# Patient Record
Sex: Male | Born: 1985
Health system: Southern US, Community
[De-identification: ages and names within clinical notes are randomized; demographics above are authoritative.]

## PROBLEM LIST (undated history)

## (undated) ENCOUNTER — Ambulatory Visit (HOSPITAL_COMMUNITY): Admission: EM | Payer: Self-pay

---
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
## 2005-07-09 ENCOUNTER — Emergency Department (HOSPITAL_COMMUNITY): Admission: EM | Admit: 2005-07-09 | Discharge: 2005-07-09 | Payer: Self-pay | Admitting: Emergency Medicine

## 2005-10-03 ENCOUNTER — Emergency Department (HOSPITAL_COMMUNITY): Admission: EM | Admit: 2005-10-03 | Discharge: 2005-10-03 | Payer: Self-pay | Admitting: Emergency Medicine

## 2005-12-04 ENCOUNTER — Emergency Department (HOSPITAL_COMMUNITY): Admission: EM | Admit: 2005-12-04 | Discharge: 2005-12-04 | Payer: Self-pay | Admitting: Emergency Medicine

## 2006-02-24 ENCOUNTER — Emergency Department (HOSPITAL_COMMUNITY): Admission: EM | Admit: 2006-02-24 | Discharge: 2006-02-24 | Payer: Self-pay | Admitting: Emergency Medicine

## 2006-06-17 ENCOUNTER — Emergency Department (HOSPITAL_COMMUNITY): Admission: EM | Admit: 2006-06-17 | Discharge: 2006-06-17 | Payer: Self-pay | Admitting: Emergency Medicine

## 2006-11-17 ENCOUNTER — Emergency Department (HOSPITAL_COMMUNITY): Admission: EM | Admit: 2006-11-17 | Discharge: 2006-11-17 | Payer: Self-pay | Admitting: Emergency Medicine

## 2009-07-21 ENCOUNTER — Emergency Department (HOSPITAL_COMMUNITY): Admission: EM | Admit: 2009-07-21 | Discharge: 2009-07-21 | Payer: Self-pay | Admitting: Family Medicine

## 2009-09-05 ENCOUNTER — Ambulatory Visit: Payer: Self-pay | Admitting: Family Medicine

## 2009-09-29 ENCOUNTER — Ambulatory Visit: Payer: Self-pay | Admitting: Internal Medicine

## 2009-09-29 LAB — CONVERTED CEMR LAB
Eosinophils Absolute: 0.1 10*3/uL (ref 0.0–0.7)
Eosinophils Relative: 2 % (ref 0–5)
GC Probe Amp, Urine: NEGATIVE
HCT: 41.4 % (ref 39.0–52.0)
HDL: 52 mg/dL (ref >39)
Hemoglobin: 13.7 g/dL (ref 13.0–17.0)
LDL Cholesterol: 66 mg/dL (ref 0–99)
Lymphocytes Relative: 34 % (ref 12–46)
Lymphs Abs: 1.2 10*3/uL (ref 0.7–4.0)
MCV: 90.6 fL (ref 78.0–100.0)
Monocytes Relative: 10 % (ref 3–12)
Platelets: 242 10*3/uL (ref 150–400)
RBC: 4.57 M/uL (ref 4.22–5.81)
Total CHOL/HDL Ratio: 2.5
Triglycerides: 61 mg/dL (ref <150)
WBC: 3.5 10*3/uL — ABNORMAL LOW (ref 4.0–10.5)

## 2009-11-02 ENCOUNTER — Ambulatory Visit: Payer: Self-pay | Admitting: Family Medicine

## 2009-11-08 ENCOUNTER — Encounter: Admission: RE | Admit: 2009-11-08 | Discharge: 2010-02-06 | Payer: Self-pay | Admitting: Family Medicine

## 2009-12-20 ENCOUNTER — Encounter (INDEPENDENT_AMBULATORY_CARE_PROVIDER_SITE_OTHER): Payer: Self-pay | Admitting: *Deleted

## 2010-01-21 ENCOUNTER — Emergency Department (HOSPITAL_COMMUNITY): Admission: EM | Admit: 2010-01-21 | Discharge: 2010-01-21 | Payer: Self-pay | Admitting: Emergency Medicine

## 2010-02-14 ENCOUNTER — Encounter
Admission: RE | Admit: 2010-02-14 | Discharge: 2010-05-15 | Payer: Self-pay | Source: Home / Self Care | Attending: Orthopedic Surgery | Admitting: Orthopedic Surgery

## 2010-06-28 NOTE — Letter (Signed)
Summary: Generic Letter  [facility]  [address] [place], [place] [address]   Phone: [phone]  Fax: [phone]           Date:12/20/2009  FT:[id number] Officer  HC:[id number] Laural Benes D.O.B. [birth year]  Mr. Ronnie Rivera is an active patient at [facility]. He is currently unable to work due to having surgery for bilateral femur fractures. He is currently being cared for by an Orthopedic Surgeon who is overseeing his recovery. Questions regarding when he will be available to return to work should be addressed to his Careers adviser.     Sincerely,   Jessie Foot

## 2010-06-28 NOTE — Letter (Signed)
Summary: Generic Letter  [facility]  [address] [place], [place] [address]   Phone: [phone]  Fax: [phone]    12/20/2009  [facility] [address] [place], [place]  [address]  Dear Mr. CHAVARRIA,           Sincerely,   Gaylyn Cheers RN

## 2010-08-10 LAB — DIFFERENTIAL
Basophils Absolute: 0 10*3/uL (ref 0.0–0.1)
Basophils Relative: 0 % (ref 0–1)
Eosinophils Absolute: 0 10*3/uL (ref 0.0–0.7)
Eosinophils Relative: 0 % (ref 0–5)
Monocytes Absolute: 0.6 10*3/uL (ref 0.1–1.0)

## 2010-08-10 LAB — CBC
HCT: 42.6 % (ref 39.0–52.0)
MCHC: 34 g/dL (ref 30.0–36.0)
MCV: 83.9 fL (ref 78.0–100.0)
Platelets: 234 10*3/uL (ref 150–400)
RDW: 14.7 % (ref 11.5–15.5)

## 2010-08-10 LAB — BASIC METABOLIC PANEL
BUN: 10 mg/dL (ref 6–23)
Chloride: 103 mEq/L (ref 96–112)
Creatinine, Ser: 0.97 mg/dL (ref 0.4–1.5)
Glucose, Bld: 114 mg/dL — ABNORMAL HIGH (ref 70–99)
Potassium: 3.5 mEq/L (ref 3.5–5.1)

## 2010-08-10 LAB — TYPE AND SCREEN: ABO/RH(D): O POS

## 2010-10-12 NOTE — Discharge Summary (Signed)
NAME:  Ronnie Rivera, Ronnie Rivera                       ACCOUNT NO.:  [id number]   MEDICAL RECORD NO.:  [id number]                   PATIENT TYPE:  INP   LOCATION:  0201                                 [facility]ACILITY:  [facility]   PHYSICIAN:  Beverly Milch, MD                  DATE OF BIRTH:  [birth year]   DATE OF ADMISSION:  03/03/2003  DATE OF DISCHARGE:  03/09/2003                                 DISCHARGE SUMMARY   IDENTIFYING DATA:  This 25 year old male who completed the 10th grade of  high school and is now working on his GED was admitted emergently  involuntarily on a mental health petition for inpatient stabilization of  acute psychosis.  The patient had reportedly had similar psychotic symptoms  in January 2004 and was started on Risperdal by Dr. Deliah Boston, which  helped but the patient was noncompliant after improvement on the medication.  For full details, please see the typed admission assessment.   HISTORY OF PRESENT ILLNESS:  The patient is initially highly suspicious and  indicates that he must get out of the hospital immediately to be taken to  complete his GED and enter the [employer].  The patient has predominantly  persecutory but also somewhat grandiose delusions that are threatening to  others, particularly mother who seems to feel the patient will hurt her.  The patient has been progressively angry and agitated with others,  suggesting that he has taken people out of church to swear at them outside  while at the same time being highly religious himself and disapproving of  cursing.  The patient reportedly was released from the [employer] after six  months in September 2004, having failed to succeed in their program and  being told from mental health evaluation that he needed additional  treatment.  The patient would not clarify the conclusions and  recommendations otherwise.  The patient, in fact, would give little  information at the time of admission about this thoughts or  intrapsychic  experience.  In reality, his father was killed by a gunshot wound three  years ago.  The patient subsequently stated that he had two cousins die  including one recently as well as another friend who was killed.  The  patient seems to reside with mother and her friend, though he may also have  a stepfather.  He had seizures from ages 2 to 48 but has not required  pharmacotherapy for that since then.  He had no side effects from Risperdal  but may be describing some extrapyramidal side effects at that time relative  to his vague concerns.   INITIAL MENTAL STATUS EXAM:  The patient had no encephalopathic or  neuropathic symptoms.  He had mild to moderate to dysphoria.  He had  intermittent pressured speech but did not have racing thoughts.  He was  irritable and at times, grandiose, but predominantly suspicious and  paranoid.  He projects that mother and another cousin are inappropriate in  their activities on the street and may put others in jeopardy for getting  injured.  He subsequently begins to tie together delusions that a certain  man that had borrowed $10.00 from his father, some Designer, television/film set,  and some other acquaintances were involved with his cousin and mother and  not getting justice done for the death of the patient's father and cousins,  by what the patient considers are murder.  The patient has no seizure  activity.  His neurological exam was otherwise intact.  He was somewhat  disorganized and misinterpreting and overinterpreting.  He did not manifest  other anxiety signs or symptoms.  He did report a history of ADHD diagnosed  at age 3 after seizure disorder resolved.   LABORATORY FINDINGS:  CBC was normal with neutrophil differential slightly  low at 41% with the lower limit of normal 43.  White count was normal at  4600, hemoglobin 14.3, MCV 89, and platelet count 274,000.  Comprehensive  metabolic panel was normal except total bilirubin  elevated at 1.5 with upper  limit of normal 1.2.  His sodium was normal at 141, potassium 3.8, glucose  95, creatinine 1.1, calcium 9.8, albumin 4.3, AST 21, and ALT 14.  GGT was  normal at 16.  TSH was normal at 0.833 with reference range 0.35-5.5.  Free  T4 was normal at 1.08 with reference range 0.89-1.80.  Urine drug screen was  negative for illicit drugs.  Urinalysis was normal except for an amber color  but Ictotest was negative.  Urine for GC and Chlamydia trachomatis probes by  DNA amplification were both negative.  RPR was nonreactive.   HOSPITAL COURSE AND TREATMENT:  General medical exam on admission was  negative including by Vic Ripper, P.A.-C.  He had the history of  seizures and noted that he felt mother had called the police because the  patient had snapped.  The patient has no medication allergies.  Vital signs  were stable throughout hospital stay with height 68 inches and weight 153.5  pounds, blood pressure 146/86 with heart rate 110 on admission standing but  163/88 supine.  Final blood pressure was 101/54 with heart rate 59 sitting  and 124/78 with heart rate of 90 standing.  The patient was started on  Risperdal at 3 mg nightly and p.r.n. as needed Zyprexa Zydis 10 mg.  He  required approximately three doses of p.r.n. Zydis through the course of his  hospital stay.  His Risperdal was titrated up to 4 mg.  He did have some  dystonic dysesthesia in his tongue as though he could not quite move it as  well as usual.  He did not have a fully established, frank dystonic spasm.  This did respond favorably to Cogentin and 2 mg nightly appeared to be  holding symptoms, though he did receive 3 mg daily total in divided doses at  some times.  The patient tolerated the Risperdal well otherwise except he  stated it tasted bad even though it felt like he might have been referring  to the Zydis.  The patient manipulated for discharge frequently throughout his hospital stay  but more so in the first 24-48 hours.  As his medications  became established, he became much more active in the treatment program and  participating, though he was still careful about what he said.  Gradually,  his fixations on killing by others and murder of  his father and cousins  became more possible to talk about and even confront at times.  However,  when mother visited on the day of anticipated discharge, the patient again  reported to mother that people were trying to kill him and she had to get  him out of there.  The patient, on the final day of hospital stay after  meeting with mother the two preceding nights on the hospital unit, had  resolved their conflict and miscommunication.  Mother was no longer afraid  of the patient but she was apprehensive about leaving him alone at home  while she was working, which is appropriate.  Therefore, mother arranged to  have grandmother watch the patient during the day.  The patient truly has no  Armed forces logistics/support/administrative officer or GED proceedings to get to.  However, he was beginning  to soften through the course of the final hospital days about just wanted to  get home and be back with family.  The patient did not have significant ADHD  symptoms at this time that required further pharmacologic consideration for  treatment, though inattention may certainly be a concern.  He did not  manifest bipolar disorder or other primary mood disorder.  He would be  dysphoric when he was not allowed to leave the hospital but he would work  through this each time.  The patient became much more stable on Risperdal.  He tolerated the medication well but did require Cogentin.  He participated  in group, milieu, behavioral, individual, special education, occupational  therapy, and recreational treatment activities.  Mother did not ever come  for family therapy sessions even though one was scheduled for the day prior  to discharge but she called that she had to work.  She  did become more  responsible in calling and setting up resources for the patient's continued  care as well as opportunities for family therapy here.  However, she was  late for his scheduled discharge, which had to be conducted with nursing  without a family session possible in the later evening.  The patient was  stable for discharge though mother wished he could stay longer.  He had no  homicide or suicide ideation.  He was cooperative and tolerated the delay in  his discharge by a day with appropriate and relations and communication with  others.   FINAL DIAGNOSES:   AXIS I:  1. Delusional disorder, persecutory type.  2. Attention-deficit hyperactivity disorder, combined type, mild to moderate     severity.  3. Oppositional defiant disorder.  4. Noncompliance with treatment.  5. Parent-child problem.  6. Other specified family circumstances.   AXIS II:  Diagnosis deferred.   AXIS III:  1. Seizure disorder now in remission for seven years. 2. Allergy to bee stings.   AXIS IV:  Stressors: Family- severe to extreme, predominantly acute and  chronic; phase of life- severe, predominantly acute; legal- mild,  predominantly chronic; school- severe, predominantly acute and chronic.   AXIS V:  Global assessment of functioning at the time of admission was 30  with highest in the last year estimated at 62 and discharge global  assessment of functioning was 48.   MEDICATIONS:  1. The patient was discharged on Risperdal 4 mg every bedtime, quantity #30     prescribed with samples of the 2 mg to use two at bedtime, quantity #28     provided.  2. Cogentin was prescribed as 2 mg to use one every bedtime and  one half or     one daily if needed for dystonia such as the tongue feeling stiff or     thick, quantity #60 with no refill prescribed.   PLAN:  The patient and mother were educated on the side effects, risks, and  proper use as well as the indications of the medications.  He is to  remain  with grandmother while mother works for the next one to two weeks.  He will  need to defer his completion of his GED and certainly defer his  consideration of entry into the [employer].  He has an appointment with  [facility] March 11, [facility], at 1400 with Barbaraann Cao and subsequent medication management can be scheduled from there,  though mother states she will need help obtaining medications.  The  patient's initial characterization of his mother as harmful to him seems to  reside in mother's lack of consistency and lack of accountability for the  patient's needs but mother seems to truly care about the patient in the long  run.  Crisis and safety plans are established if needed.                                               Beverly Milch, MD    GJ/MEDQ  D:  03/10/2003  T:  03/11/2003  Job:  [id number]   cc:   [facility]  [address]  [place], [place] [address]  Fax if possible
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
## 2011-09-19 DIAGNOSIS — S7290XA Unspecified fracture of unspecified femur, initial encounter for closed fracture: Secondary | ICD-10-CM

## 2011-09-19 HISTORY — DX: Unspecified fracture of unspecified femur, initial encounter for closed fracture: S72.90XA

## 2013-05-25 ENCOUNTER — Ambulatory Visit: Payer: Self-pay | Admitting: Internal Medicine

## 2014-05-03 DIAGNOSIS — T8484XA Pain due to internal orthopedic prosthetic devices, implants and grafts, initial encounter: Secondary | ICD-10-CM

## 2014-05-03 HISTORY — DX: Pain due to internal orthopedic prosthetic devices, implants and grafts, initial encounter: T84.84XA

## 2015-04-22 ENCOUNTER — Encounter (HOSPITAL_COMMUNITY): Payer: Self-pay | Admitting: Neurology

## 2015-04-22 ENCOUNTER — Emergency Department (HOSPITAL_COMMUNITY): Payer: Self-pay

## 2015-04-22 ENCOUNTER — Emergency Department (HOSPITAL_COMMUNITY)
Admission: EM | Admit: 2015-04-22 | Discharge: 2015-04-22 | Disposition: A | Discharge status: Home / Self Care | Payer: Self-pay | Attending: Emergency Medicine | Admitting: Emergency Medicine

## 2015-04-22 DIAGNOSIS — B9789 Other viral agents as the cause of diseases classified elsewhere: Secondary | ICD-10-CM

## 2015-04-22 DIAGNOSIS — R197 Diarrhea, unspecified: Secondary | ICD-10-CM

## 2015-04-22 DIAGNOSIS — R109 Unspecified abdominal pain: Secondary | ICD-10-CM | POA: Insufficient documentation

## 2015-04-22 DIAGNOSIS — J069 Acute upper respiratory infection, unspecified: Secondary | ICD-10-CM

## 2015-04-22 LAB — CBC
HCT: 39.1 % (ref 39.0–52.0)
Hemoglobin: 12.8 g/dL — ABNORMAL LOW (ref 13.0–17.0)
MCH: 30.9 pg (ref 26.0–34.0)
MCHC: 32.7 g/dL (ref 30.0–36.0)
MCV: 94.4 fL (ref 78.0–100.0)
PLATELETS: 199 10*3/uL (ref 150–400)
RBC: 4.14 MIL/uL — AB (ref 4.22–5.81)
RDW: 12.4 % (ref 11.5–15.5)
WBC: 4.8 10*3/uL (ref 4.0–10.5)

## 2015-04-22 LAB — COMPREHENSIVE METABOLIC PANEL
ALK PHOS: 59 U/L (ref 38–126)
ALT: 52 U/L (ref 17–63)
AST: 39 U/L (ref 15–41)
Albumin: 3.7 g/dL (ref 3.5–5.0)
Anion gap: 6 (ref 5–15)
BILIRUBIN TOTAL: 0.7 mg/dL (ref 0.3–1.2)
BUN: 11 mg/dL (ref 6–20)
CALCIUM: 8.7 mg/dL — AB (ref 8.9–10.3)
CO2: 27 mmol/L (ref 22–32)
CREATININE: 0.92 mg/dL (ref 0.61–1.24)
Chloride: 108 mmol/L (ref 101–111)
Glucose, Bld: 98 mg/dL (ref 65–99)
Potassium: 4.6 mmol/L (ref 3.5–5.1)
Sodium: 141 mmol/L (ref 135–145)
TOTAL PROTEIN: 5.7 g/dL — AB (ref 6.5–8.1)

## 2015-04-22 LAB — LIPASE, BLOOD: Lipase: 28 U/L (ref 11–51)

## 2015-04-22 NOTE — ED Provider Notes (Signed)
CSN: [id number]     Arrival date & time 04/22/15  0915 History   First MD Initiated Contact with Patient 04/22/15 [phone]     Chief Complaint  Patient presents with  . Cough  . Diarrhea     (Consider location/radiation/quality/duration/timing/severity/associated sxs/prior Treatment) HPI  29 year old male with no pertinent past medical history presents with a cough for over one week. States it initially was brown sputum, then yellow, and now green. Had some fevers last week but no fever since. Has been having on-and-off rhinorrhea and sneezing. Denies shortness of breath. No hemoptysis. Over this last week he has also been having on-and-off diarrhea. He states the stools seem normal but he has 3 or 4 per day. This will alternate between multiple stools 1 day and then no stools the next. Denies blood in his stools. Occasionally has some discomfort that he describes as gas in his abdomen but has none now. Last abdominal pain was yesterday. No urinary symptoms.  History reviewed. No pertinent past medical history. History reviewed. No pertinent past surgical history. No family history on file. Social History  Substance Use Topics  . Smoking status: Never Smoker   . Smokeless tobacco: None  . Alcohol Use: Yes    Review of Systems  Constitutional: Negative for fever.  HENT: Positive for congestion, rhinorrhea and sneezing. Negative for sore throat.   Respiratory: Positive for cough. Negative for shortness of breath.   Cardiovascular: Negative for chest pain.  Gastrointestinal: Positive for abdominal pain and diarrhea. Negative for vomiting.  All other systems reviewed and are negative.     Allergies  Review of patient's allergies indicates no known allergies.  Home Medications   Prior to Admission medications   Not on File   BP 137/80 mmHg  Pulse 63  Temp(Src) 97.7 F (36.5 C) (Oral)  Resp 18  SpO2 100% Physical Exam  Constitutional: He is oriented to person, place, and  time. He appears well-developed and well-nourished.  HENT:  Head: Normocephalic and atraumatic.  Right Ear: External ear normal.  Left Ear: External ear normal.  Nose: Nose normal.  Eyes: Right eye exhibits no discharge. Left eye exhibits no discharge.  Neck: Neck supple.  Cardiovascular: Normal rate, regular rhythm, normal heart sounds and intact distal pulses.   Pulmonary/Chest: Effort normal and breath sounds normal. He has no wheezes. He has no rales.  Abdominal: Soft. He exhibits no distension. There is no tenderness.  Musculoskeletal: He exhibits no edema.  Neurological: He is alert and oriented to person, place, and time.  Skin: Skin is warm and dry.  Nursing note and vitals reviewed.   ED Course  Procedures (including critical care time) Labs Review Labs Reviewed  COMPREHENSIVE METABOLIC PANEL - Abnormal; Notable for the following:    Calcium 8.7 (*)    Total Protein 5.7 (*)    All other components within normal limits  CBC - Abnormal; Notable for the following:    RBC 4.14 (*)    Hemoglobin 12.8 (*)    All other components within normal limits  LIPASE, BLOOD    Imaging Review Dg Chest 2 View  04/22/2015  CLINICAL DATA:  Cough, congestion, fever, nausea and diarrhea 1 week. EXAM: CHEST  2 VIEW COMPARISON:  02/24/2006 FINDINGS: Lungs are adequately inflated without consolidation or effusion. Borderline cardiomegaly. Remaining bones and soft tissues are within normal. IMPRESSION: No active cardiopulmonary disease. Electronically Signed   By: Elberta Fortis M.D.   On: 04/22/2015 11:05   I have personally  reviewed and evaluated these images and lab results as part of my medical decision-making.   EKG Interpretation None      MDM   Final diagnoses:  Viral upper respiratory tract infection with cough  Diarrhea, unspecified type    Patient's cough and respiratory symptoms are most likely from a viral URI. No evidence of pneumonia. No vomiting, mild diarrhea but has  normal electrolytes and renal function. Benign abdominal exam. Plan to treat symptomatically and conservatively. Recommend over-the-counter treatments for cough and URI symptoms. Discussed return precautions.    Pricilla LovelessScott Tanaya Dunigan, MD 04/22/15 1155

## 2015-04-22 NOTE — ED Notes (Signed)
Patient transported to X-ray 

## 2015-04-22 NOTE — ED Notes (Signed)
Pt repots productive cough with yellow mucus for 1 week and sneezing. Also has been having diarrhea.

## 2015-12-15 ENCOUNTER — Encounter (HOSPITAL_COMMUNITY): Payer: Self-pay | Admitting: *Deleted

## 2015-12-15 ENCOUNTER — Emergency Department (HOSPITAL_COMMUNITY)
Admission: EM | Admit: 2015-12-15 | Discharge: 2015-12-15 | Disposition: A | Discharge status: Home / Self Care | Payer: Self-pay | Attending: Emergency Medicine | Admitting: Emergency Medicine

## 2015-12-15 DIAGNOSIS — R197 Diarrhea, unspecified: Secondary | ICD-10-CM | POA: Insufficient documentation

## 2015-12-15 NOTE — ED Notes (Signed)
EDP at bedside  

## 2015-12-15 NOTE — ED Notes (Signed)
Pt reports generalized abd pain and diarrhea x 2 days. Denies vomiting.

## 2015-12-15 NOTE — ED Provider Notes (Signed)
CSN: [id number]     Arrival date & time 12/15/15  0912 History   First MD Initiated Contact with Patient 12/15/15 [phone]     Chief Complaint  Patient presents with  . Abdominal Pain     (Consider location/radiation/quality/duration/timing/severity/associated sxs/prior Treatment) HPI  30 year old male presents for evaluation of abdominal pain and diarrhea starting 2 days ago. Has not had any vomiting but has had intermittent nausea, most recently last night. The diarrhea is about 4 or 5 times per day and is loose and watery. No blood. Has not had any diarrhea this morning, also has not eaten this morning. Patient states abdominal pain comes and goes and is cramping. Sometimes occurs when drinking soda and other times when eating. He denies recent camping, travel, or antibiotic use. No fevers. He is not currently having any lightheadedness or dizziness. Pain is currently mild. States he was sent from work needing a note saying he could return to work.  History reviewed. No pertinent past medical history. History reviewed. No pertinent past surgical history. History reviewed. No pertinent family history. Social History  Substance Use Topics  . Smoking status: Never Smoker   . Smokeless tobacco: None  . Alcohol Use: Yes    Review of Systems  Constitutional: Negative for fever.  Gastrointestinal: Positive for abdominal pain and diarrhea. Negative for nausea, vomiting and blood in stool.  Neurological: Negative for dizziness and light-headedness.  All other systems reviewed and are negative.     Allergies  Review of patient's allergies indicates no known allergies.  Home Medications   Prior to Admission medications   Not on File   BP 129/64 mmHg  Pulse 78  Temp(Src) 98.6 F (37 C) (Oral)  Resp 15  SpO2 97% Physical Exam  Constitutional: He is oriented to person, place, and time. He appears well-developed and well-nourished.  HENT:  Head: Normocephalic and atraumatic.  Right  Ear: External ear normal.  Left Ear: External ear normal.  Nose: Nose normal.  Mouth/Throat: Oropharynx is clear and moist.  Eyes: Right eye exhibits no discharge. Left eye exhibits no discharge.  Neck: Neck supple.  Cardiovascular: Normal rate, regular rhythm, normal heart sounds and intact distal pulses.   Pulmonary/Chest: Effort normal and breath sounds normal.  Abdominal: Soft. He exhibits no distension. There is no tenderness.  Musculoskeletal: He exhibits no edema.  Neurological: He is alert and oriented to person, place, and time.  Skin: Skin is warm and dry.  Nursing note and vitals reviewed.   ED Course  Procedures (including critical care time) Labs Review Labs Reviewed - No data to display  Imaging Review No results found. I have personally reviewed and evaluated these images and lab results as part of my medical decision-making.   EKG Interpretation None      MDM   Final diagnoses:  Diarrhea, unspecified type    Patient overall appears well, and appears well hydrated. No signs of significant dehydration. At this point with benign vital signs and no abdominal tenderness or other acute abnormality on exam I do not think workup such as lab work, IV, or imaging is needed. Discusses with patient who agrees. Discussed over-the-counter remedies such as Pepto-Bismol and Imodium. Discussed strict return precautions as well as well as importance of oral hydration.    Pricilla LovelessScott Aerianna Losey, MD 12/15/15 [phone]

## 2015-12-15 NOTE — ED Notes (Signed)
NAD at this time. PT is stable and going home.

## 2015-12-28 ENCOUNTER — Encounter (HOSPITAL_COMMUNITY): Payer: Self-pay | Admitting: *Deleted

## 2015-12-28 ENCOUNTER — Emergency Department (HOSPITAL_COMMUNITY): Payer: Self-pay

## 2015-12-28 ENCOUNTER — Emergency Department (HOSPITAL_COMMUNITY)
Admission: EM | Admit: 2015-12-28 | Discharge: 2015-12-28 | Disposition: A | Discharge status: Home / Self Care | Payer: Self-pay | Attending: Emergency Medicine | Admitting: Emergency Medicine

## 2015-12-28 DIAGNOSIS — S62636A Displaced fracture of distal phalanx of right little finger, initial encounter for closed fracture: Secondary | ICD-10-CM | POA: Insufficient documentation

## 2015-12-28 DIAGNOSIS — Y990 Civilian activity done for income or pay: Secondary | ICD-10-CM | POA: Insufficient documentation

## 2015-12-28 DIAGNOSIS — Y929 Unspecified place or not applicable: Secondary | ICD-10-CM | POA: Insufficient documentation

## 2015-12-28 DIAGNOSIS — W230XXA Caught, crushed, jammed, or pinched between moving objects, initial encounter: Secondary | ICD-10-CM | POA: Insufficient documentation

## 2015-12-28 DIAGNOSIS — S62609A Fracture of unspecified phalanx of unspecified finger, initial encounter for closed fracture: Secondary | ICD-10-CM

## 2015-12-28 DIAGNOSIS — Y9389 Activity, other specified: Secondary | ICD-10-CM | POA: Insufficient documentation

## 2015-12-28 NOTE — ED Provider Notes (Signed)
[facility]-EMERGENCY DEPT Provider Note   CSN: [id number] Arrival date & time: 12/28/15  9480  First Provider Contact:  First MD Initiated Contact with Patient 12/28/15 0802        History   Chief Complaint Chief Complaint  Patient presents with  . Hand Pain    HPI EMANI AFABLE is a 30 y.o. male.  Patient presents to the ED with a chief complaint of right small finger pain.  States that he pinched his small finger in between some iron rods last night at work.  He works for [employer].  He states that he tried applying ice.  Has has not taken anything for his symptoms.  His symptoms are worsened with movement and palpation.   The history is provided by the patient and the nursing home. No language interpreter was used.    History reviewed. No pertinent past medical history.  There are no active problems to display for this patient.   History reviewed. No pertinent surgical history.     Home Medications    Prior to Admission medications   Not on File    Family History No family history on file.  Social History Social History  Substance Use Topics  . Smoking status: Never Smoker  . Smokeless tobacco: Not on file  . Alcohol use No     Allergies   Review of patient's allergies indicates no known allergies.   Review of Systems Review of Systems  All other systems reviewed and are negative.    Physical Exam Updated Vital Signs BP 152/77 (BP Location: Left Arm)   Pulse 62   Temp 98 F (36.7 C) (Oral)   Resp 20   Ht 5\' 9"  (1.753 m)   Wt 91.6 kg   SpO2 99%   BMI 29.83 kg/m   Physical Exam Physical Exam  Constitutional: Pt appears well-developed and well-nourished. No distress.  HENT:  Head: Normocephalic and atraumatic.  Eyes: Conjunctivae are normal.  Neck: Normal range of motion.  Cardiovascular: Normal rate, regular rhythm and intact distal pulses.   Capillary refill < 3 sec  Pulmonary/Chest: Effort normal and breath sounds normal.    Musculoskeletal: Pt exhibits tenderness to palpation over right small finger at the distal phalanx with associated contusion, no laceration. Pt exhibits no edema.  ROM: Right small finger 4/5 limited by pain  Neurological: Pt  is alert. Coordination normal.  Sensation 5/5 Strength 4/5 right small finger limited by pain  Skin: Skin is warm and dry. Pt is not diaphoretic.  No tenting of the skin  Psychiatric: Pt has a normal mood and affect.  Nursing note and vitals reviewed.   ED Treatments / Results   Radiology Dg Finger Little Right  Result Date: 12/28/2015 CLINICAL DATA:  Machine fell on finger EXAM: RIGHT FIFTH FINGER 2+V COMPARISON:  None. FINDINGS: Frontal, oblique, and lateral views were obtained. There is a fracture of the distal aspect of the fifth distal phalanx with the most distal aspect displaced volar to the remainder of the digit. There is extensive arthropathy in the fifth DIP joint with bony overgrowth along the dorsal aspect of the proximal portion of the fifth distal phalanx and mild volar subluxation of the distal fifth phalanx compared to the middle phalanx, chronic in appearance. There is soft tissue swelling. No other evidence of fracture. No dislocation. Other joint spaces appear unremarkable. IMPRESSION: Fracture distal aspect fifth distal phalanx with displacement of the distal fracture fragment volar with respect to the remainder the phalanx.  Arthropathy in the dorsal proximal aspect of the distal phalanx with mild subluxation at the DIP joint which appears chronic. No frank dislocation. No other fractures. Other joint spaces appear within normal limits. Electronically Signed   By: Bretta Bang III M.D.   On: 12/28/2015 08:16    Procedures Procedures     Initial Impression / Assessment and Plan / ED Course  I have reviewed the triage vital signs and the nursing notes.  Pertinent labs & imaging results that were available during my care of the patient were  reviewed by me and considered in my medical decision making (see chart for details).  Clinical Course    Patient with right small finger distal phalanx fracture.  Closed injury.  Splint in ED.  PCP follow-up in 2 weeks.  Final Clinical Impressions(s) / ED Diagnoses   Final diagnoses:  Finger fracture, closed, initial encounter    New Prescriptions New Prescriptions   No medications on file     [name], PA-C 12/28/15 1610    Shaune Pollack, MD 12/28/15 2120

## 2015-12-28 NOTE — ED Triage Notes (Signed)
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

## 2015-12-28 NOTE — ED Notes (Signed)
Declined W/C at D/C and was escorted to lobby by RN. 

## 2016-01-19 ENCOUNTER — Emergency Department (HOSPITAL_COMMUNITY)
Admission: EM | Admit: 2016-01-19 | Discharge: 2016-01-19 | Disposition: A | Discharge status: Home / Self Care | Payer: Self-pay | Attending: Emergency Medicine | Admitting: Emergency Medicine

## 2016-01-19 ENCOUNTER — Encounter (HOSPITAL_COMMUNITY): Payer: Self-pay

## 2016-01-19 DIAGNOSIS — H6122 Impacted cerumen, left ear: Secondary | ICD-10-CM | POA: Insufficient documentation

## 2016-01-19 NOTE — ED Notes (Signed)
Pt stable, ambulatory, states understanding of discharge instructions 

## 2016-01-19 NOTE — ED Triage Notes (Signed)
Pt reports he feels like his left ear may be infected or clogged. Hx of ear wax impaction. Pt reports decreased hearing and the feeling of water stuck in the ear. Pt reports minimal pain.

## 2016-01-19 NOTE — ED Notes (Signed)
C/o left ear "stopped up". States has had problems with this in the past.

## 2016-01-19 NOTE — ED Provider Notes (Signed)
[facility]-EM[facility]Y [facility]PT Provider Note   CSN: [id number] Arrival date & time: 01/19/16  1216  By signing my name below, I, Ronnie Rivera, attest that this documentation has been prepared under the direction and in the presence of Newell RubbermaidJeffrey Laurna Shetley, PA-C. Electronically signed by: Ronnie Rivera, ED Scribe. 01/19/16. 2:48 PM.   History   Chief Complaint Chief Complaint  Patient presents with  . Otalgia   The history is provided by the patient. No language interpreter was used.   HPI Comments:  Ronnie Rivera is a 30 y.o. male with a history of cerumen impaction who presents to the Emergency Department complaining of otalgia, which started yesterday ago. Localized to left ear. Pt reports that if feels like water is stuck in the ear. Associated symptoms include decreased hearing. Denies significant pain, fever, congestion or rhinorrhea.   History reviewed. No pertinent past medical history.  There are no active problems to display for this patient.   History reviewed. No pertinent surgical history.     Home Medications    Prior to Admission medications   Not on File    Family History History reviewed. No pertinent family history.  Social History Social History  Substance Use Topics  . Smoking status: Never Smoker  . Smokeless tobacco: Never Used  . Alcohol use No     Allergies   Review of patient's allergies indicates no known allergies.   Review of Systems Review of Systems  Constitutional: Negative for fever.  HENT: Positive for ear pain and hearing loss. Negative for congestion and rhinorrhea.   All other systems reviewed and are negative.    Physical Exam Updated Vital Signs BP 128/88   Pulse 66   Temp 98.4 F (36.9 C) (Oral)   Resp 17   Ht 5\' 9"  (1.753 m)   Wt 90.7 kg   SpO2 98%   BMI 29.53 kg/m   Physical Exam  Constitutional: He is oriented to person, place, and time. He appears well-developed and well-nourished. No distress.  HENT:  Head:  Normocephalic and atraumatic.  No signs of swelling or edema to the ear. Nontender to palpation.  Cerumen impaction in left ear.  Eyes: Conjunctivae are normal. Right eye exhibits no discharge. Left eye exhibits no discharge. No scleral icterus.  Cardiovascular: Normal rate.   Pulmonary/Chest: Effort normal.  Neurological: He is alert and oriented to person, place, and time. Coordination normal.  Skin: Skin is warm and dry. No rash noted. He is not diaphoretic. No erythema. No pallor.  Psychiatric: He has a normal mood and affect. His behavior is normal.  Nursing note and vitals reviewed.    ED Treatments / Results  DIAGNOSTIC STUDIES:  Oxygen Saturation is 97% on room air, normal by my interpretation.    COORDINATION OF CARE:  2:38 PM Discussed treatment plan with pt at bedside, which includes removal of cerumen impaction, and pt agreed to plan.   Labs (all labs ordered are listed, but only abnormal results are displayed) Labs Reviewed - No data to display  EKG  EKG Interpretation None       Radiology No results found.  Procedures Procedures (including critical care time)  Medications Ordered in ED Medications - No data to display   Initial Impression / Assessment and Plan / ED Course  I have reviewed the triage vital signs and the nursing notes.  Pertinent labs & imaging results that were available during my care of the patient were reviewed by me and considered in my medical  decision making (see chart for details).  Clinical Course    Labs:   Imaging:   Consults:   Therapeutics: Cerumen impaction removal.  Discharge Meds:  Assessment/Plan:  Patient presents with cerumen impaction on the left ear. I personally disimpacted the cerumen, and was able to get most of it out. Was not able to remove entirety of it, I decided not to continue as he did not want to damage external auditory canal or tympanic membrane. Patient will need to put earwax softening  drops and follow-up with primary care for reevaluation. I have very low suspicion for an otitis media, otitis externa, or any other significant pathology. Strict return precautions given, he verbalized understanding and agreement to today's plan had no further questions or concerns at time of discharge  Final Clinical Impressions(s) / ED Diagnoses   Final diagnoses:  Cerumen impaction, left    New Prescriptions There are no discharge medications for this patient. I personally performed the services described in this documentation, which was scribed in my presence. The recorded information has been reviewed and is accurate.     Eyvonne Mechanic, PA-C 01/19/16 1658    Loren Racer, MD 01/24/16 1247

## 2017-01-17 ENCOUNTER — Emergency Department (HOSPITAL_COMMUNITY): Payer: Self-pay

## 2017-01-17 ENCOUNTER — Encounter (HOSPITAL_COMMUNITY): Payer: Self-pay | Admitting: Emergency Medicine

## 2017-01-17 DIAGNOSIS — Y9389 Activity, other specified: Secondary | ICD-10-CM | POA: Insufficient documentation

## 2017-01-17 DIAGNOSIS — S01411A Laceration without foreign body of right cheek and temporomandibular area, initial encounter: Secondary | ICD-10-CM | POA: Insufficient documentation

## 2017-01-17 DIAGNOSIS — Y998 Other external cause status: Secondary | ICD-10-CM | POA: Insufficient documentation

## 2017-01-17 DIAGNOSIS — S01111A Laceration without foreign body of right eyelid and periocular area, initial encounter: Secondary | ICD-10-CM | POA: Insufficient documentation

## 2017-01-17 DIAGNOSIS — H6122 Impacted cerumen, left ear: Secondary | ICD-10-CM | POA: Insufficient documentation

## 2017-01-17 DIAGNOSIS — S0990XA Unspecified injury of head, initial encounter: Secondary | ICD-10-CM | POA: Insufficient documentation

## 2017-01-17 DIAGNOSIS — Y929 Unspecified place or not applicable: Secondary | ICD-10-CM | POA: Insufficient documentation
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
## 2017-01-18 ENCOUNTER — Emergency Department (HOSPITAL_COMMUNITY)
Admission: EM | Admit: 2017-01-18 | Discharge: 2017-01-18 | Disposition: A | Discharge status: Home / Self Care | Payer: Self-pay | Attending: Physician Assistant | Admitting: Physician Assistant

## 2017-01-18 DIAGNOSIS — H6122 Impacted cerumen, left ear: Secondary | ICD-10-CM

## 2017-01-18 DIAGNOSIS — S0990XA Unspecified injury of head, initial encounter: Secondary | ICD-10-CM

## 2017-01-18 DIAGNOSIS — S01111A Laceration without foreign body of right eyelid and periocular area, initial encounter: Secondary | ICD-10-CM

## 2017-01-18 DIAGNOSIS — S0181XA Laceration without foreign body of other part of head, initial encounter: Secondary | ICD-10-CM

## 2017-01-18 LAB — URINALYSIS, ROUTINE W REFLEX MICROSCOPIC
Bilirubin Urine: NEGATIVE
Glucose, UA: NEGATIVE mg/dL
Hgb urine dipstick: NEGATIVE
Ketones, ur: 20 mg/dL — AB
LEUKOCYTES UA: NEGATIVE
NITRITE: NEGATIVE
PROTEIN: NEGATIVE mg/dL
SPECIFIC GRAVITY, URINE: 1.028 (ref 1.005–1.030)
pH: 5 (ref 5.0–8.0)

## 2017-01-18 MED ORDER — LIDOCAINE HCL (PF) 1 % IJ SOLN
INTRAMUSCULAR | Status: AC
  Administered 2017-01-18: 5 mL via INTRADERMAL
  Filled 2017-01-18: qty 5

## 2017-01-18 MED ORDER — NAPROXEN 500 MG PO TABS: 500.0000 mg | ORAL_TABLET | Freq: Two times a day (BID) | ORAL | 0 refills | Status: DC

## 2017-01-18 MED ORDER — CARBAMIDE PEROXIDE 6.5 % OT SOLN: 5.0000 [drp] | Freq: Two times a day (BID) | OTIC | 0 refills | Status: DC

## 2017-01-18 MED ORDER — LIDOCAINE HCL (PF) 1 % IJ SOLN
5.0000 mL | Freq: Once | INTRAMUSCULAR | Status: AC
  Administered 2017-01-18: 5 mL via INTRADERMAL

## 2017-01-18 NOTE — ED Notes (Signed)
Patient updated on delay and wait time

## 2017-01-18 NOTE — ED Notes (Signed)
Patient updated on delays 

## 2017-01-18 NOTE — ED Notes (Signed)
The patient was given juice per PA, given urinal and informed of need for urinalysis.

## 2017-01-18 NOTE — ED Notes (Signed)
Pt left ear irrigated using peroxide and sterile saline.
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
## 2017-01-18 NOTE — Discharge Instructions (Signed)
Keep your  lacerations clean. Apply bacitracin twice a day. Apply an ice pack to help with bruising and swelling. Take naproxen for pain. Follow-up in 5-7 days for suture removal.

## 2017-01-22 ENCOUNTER — Encounter (HOSPITAL_COMMUNITY): Payer: Self-pay | Admitting: Emergency Medicine

## 2017-01-22 DIAGNOSIS — Y9389 Activity, other specified: Secondary | ICD-10-CM | POA: Insufficient documentation

## 2017-01-22 DIAGNOSIS — Y999 Unspecified external cause status: Secondary | ICD-10-CM | POA: Insufficient documentation

## 2017-01-22 DIAGNOSIS — X58XXXD Exposure to other specified factors, subsequent encounter: Secondary | ICD-10-CM | POA: Insufficient documentation

## 2017-01-22 DIAGNOSIS — S0531XD Ocular laceration without prolapse or loss of intraocular tissue, right eye, subsequent encounter: Secondary | ICD-10-CM | POA: Insufficient documentation

## 2017-01-22 DIAGNOSIS — J029 Acute pharyngitis, unspecified: Secondary | ICD-10-CM | POA: Insufficient documentation

## 2017-01-22 DIAGNOSIS — Y929 Unspecified place or not applicable: Secondary | ICD-10-CM | POA: Insufficient documentation
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
## 2017-01-23 ENCOUNTER — Emergency Department (HOSPITAL_COMMUNITY)
Admission: EM | Admit: 2017-01-23 | Discharge: 2017-01-23 | Disposition: A | Discharge status: Home / Self Care | Payer: Self-pay | Attending: Emergency Medicine | Admitting: Emergency Medicine

## 2017-01-23 DIAGNOSIS — J029 Acute pharyngitis, unspecified: Secondary | ICD-10-CM

## 2017-01-23 DIAGNOSIS — Z4802 Encounter for removal of sutures: Secondary | ICD-10-CM

## 2017-01-23 LAB — RAPID STREP SCREEN (MED CTR MEBANE ONLY): STREPTOCOCCUS, GROUP A SCREEN (DIRECT): NEGATIVE
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
## 2017-01-23 NOTE — Discharge Instructions (Signed)
1. Medications: usual home medications 2. Treatment: rest, drink plenty of fluids, keep wounds clean with warm soap and water. Apply sunscreen regularly, use derma if worried about scarring. 3. Follow Up: Please followup with your primary doctor and ear nose and throat in approximately 7 days for discussion of your diagnoses and further evaluation after today's visit; if you do not have a primary care doctor use the resource guide provided to find one; Please return to the ER for difficulty breathing, difficult swallowing, increasing pain, fevers or chills, signs of infection

## 2017-01-25 LAB — CULTURE, GROUP A STREP (THRC)

## 2017-02-16 ENCOUNTER — Emergency Department (HOSPITAL_COMMUNITY): Payer: Self-pay

## 2017-02-16 ENCOUNTER — Encounter (HOSPITAL_COMMUNITY): Payer: Self-pay | Admitting: *Deleted

## 2017-02-16 DIAGNOSIS — M549 Dorsalgia, unspecified: Secondary | ICD-10-CM | POA: Insufficient documentation

## 2017-02-16 DIAGNOSIS — S0990XA Unspecified injury of head, initial encounter: Secondary | ICD-10-CM | POA: Insufficient documentation

## 2017-02-16 DIAGNOSIS — Y999 Unspecified external cause status: Secondary | ICD-10-CM | POA: Insufficient documentation

## 2017-02-16 DIAGNOSIS — Y929 Unspecified place or not applicable: Secondary | ICD-10-CM | POA: Insufficient documentation

## 2017-02-16 DIAGNOSIS — M25561 Pain in right knee: Secondary | ICD-10-CM | POA: Insufficient documentation

## 2017-02-16 DIAGNOSIS — Y939 Activity, unspecified: Secondary | ICD-10-CM | POA: Insufficient documentation

## 2017-02-16 DIAGNOSIS — Z79899 Other long term (current) drug therapy: Secondary | ICD-10-CM | POA: Insufficient documentation
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
## 2017-02-17 ENCOUNTER — Emergency Department (HOSPITAL_COMMUNITY): Payer: Self-pay

## 2017-02-17 ENCOUNTER — Emergency Department (HOSPITAL_COMMUNITY)
Admission: EM | Admit: 2017-02-17 | Discharge: 2017-02-17 | Disposition: A | Discharge status: Home / Self Care | Payer: Self-pay | Attending: Emergency Medicine | Admitting: Emergency Medicine

## 2017-02-17 DIAGNOSIS — M25561 Pain in right knee: Secondary | ICD-10-CM

## 2017-02-17 DIAGNOSIS — S0990XA Unspecified injury of head, initial encounter: Secondary | ICD-10-CM

## 2017-02-17 LAB — COMPREHENSIVE METABOLIC PANEL
ALK PHOS: 58 U/L (ref 38–126)
ALT: 28 U/L (ref 17–63)
ANION GAP: 9 (ref 5–15)
AST: 44 U/L — ABNORMAL HIGH (ref 15–41)
Albumin: 3.9 g/dL (ref 3.5–5.0)
BUN: 8 mg/dL (ref 6–20)
CALCIUM: 8.5 mg/dL — AB (ref 8.9–10.3)
CO2: 27 mmol/L (ref 22–32)
CREATININE: 1 mg/dL (ref 0.61–1.24)
Chloride: 105 mmol/L (ref 101–111)
GFR calc non Af Amer: >60 mL/min (ref >60)
GLUCOSE: 92 mg/dL (ref 65–99)
Potassium: 3.8 mmol/L (ref 3.5–5.1)
Sodium: 141 mmol/L (ref 135–145)
TOTAL PROTEIN: 6.3 g/dL — AB (ref 6.5–8.1)
Total Bilirubin: 1.3 mg/dL — ABNORMAL HIGH (ref 0.3–1.2)

## 2017-02-17 LAB — URINALYSIS, ROUTINE W REFLEX MICROSCOPIC
BILIRUBIN URINE: NEGATIVE
GLUCOSE, UA: NEGATIVE mg/dL
Hgb urine dipstick: NEGATIVE
KETONES UR: 20 mg/dL — AB
LEUKOCYTES UA: NEGATIVE
NITRITE: NEGATIVE
PH: 5 (ref 5.0–8.0)
PROTEIN: NEGATIVE mg/dL
Specific Gravity, Urine: 1.025 (ref 1.005–1.030)

## 2017-02-17 LAB — CBC WITH DIFFERENTIAL/PLATELET
Basophils Absolute: 0 10*3/uL (ref 0.0–0.1)
Basophils Relative: 0 %
Eosinophils Absolute: 0 10*3/uL (ref 0.0–0.7)
Eosinophils Relative: 0 %
HEMATOCRIT: 37.6 % — AB (ref 39.0–52.0)
HEMOGLOBIN: 12.9 g/dL — AB (ref 13.0–17.0)
LYMPHS ABS: 1.2 10*3/uL (ref 0.7–4.0)
LYMPHS PCT: 15 %
MCH: 30.9 pg (ref 26.0–34.0)
MCHC: 34.3 g/dL (ref 30.0–36.0)
MCV: 90 fL (ref 78.0–100.0)
MONOS PCT: 9 %
Monocytes Absolute: 0.7 10*3/uL (ref 0.1–1.0)
NEUTROS ABS: 6.1 10*3/uL (ref 1.7–7.7)
Neutrophils Relative %: 76 %
Platelets: 207 10*3/uL (ref 150–400)
RBC: 4.18 MIL/uL — AB (ref 4.22–5.81)
RDW: 12.4 % (ref 11.5–15.5)
WBC: 8 10*3/uL (ref 4.0–10.5)

## 2017-02-17 MED ORDER — HYDROCODONE-ACETAMINOPHEN 5-325 MG PO TABS
1.0000 | ORAL_TABLET | Freq: Once | ORAL | Status: AC
  Administered 2017-02-17: 1 via ORAL
  Filled 2017-02-17: qty 1

## 2017-02-17 MED ORDER — HYDROCODONE-ACETAMINOPHEN 5-325 MG PO TABS: 1.0000 | ORAL_TABLET | ORAL | 0 refills | Status: DC | PRN
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
## 2017-02-17 NOTE — ED Notes (Signed)
Let pt know of need for urine sample. Pt reports he is unable to go at present time. Left urinal at bedside.
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
## 2017-03-18 ENCOUNTER — Encounter (HOSPITAL_COMMUNITY): Payer: Self-pay | Admitting: *Deleted

## 2017-03-18 DIAGNOSIS — R05 Cough: Secondary | ICD-10-CM | POA: Insufficient documentation

## 2017-03-18 NOTE — ED Triage Notes (Signed)
The pt is c/o an itching throat throw and a cough  He wants to be checked for pneumonia  No temp non-smoker  Alert no distress

## 2017-03-19 ENCOUNTER — Emergency Department (HOSPITAL_COMMUNITY)
Admission: EM | Admit: 2017-03-19 | Discharge: 2017-03-19 | Disposition: A | Discharge status: Home / Self Care | Payer: Self-pay | Attending: Emergency Medicine | Admitting: Emergency Medicine

## 2017-03-19 ENCOUNTER — Emergency Department (HOSPITAL_COMMUNITY): Payer: Self-pay

## 2017-03-19 DIAGNOSIS — R059 Cough, unspecified: Secondary | ICD-10-CM

## 2017-03-19 DIAGNOSIS — R05 Cough: Secondary | ICD-10-CM

## 2017-03-19 NOTE — Discharge Instructions (Signed)
Your x-ray today was normal.  No pneumonia. Follow-up with your doctor. Can try over the counter medications for cough if needed.

## 2017-03-19 NOTE — ED Notes (Signed)
Pt taken to xray 

## 2017-03-19 NOTE — ED Notes (Signed)
PT states understanding of care given, follow up care. PT ambulated from ED to car with a steady gait.  

## 2017-03-19 NOTE — ED Notes (Signed)
Patient is A&Ox4.  No signs of distress noted.  Please see providers complete history and physical exam.  

## 2017-03-19 NOTE — ED Provider Notes (Signed)
[facility] EMERGENCY DEPARTMENT Provider Note   CSN: [id number] Arrival date & time: 03/18/17  2308     History   Chief Complaint Chief Complaint  Patient presents with  . Cough    HPI Kazden L Yasuda is a 31 y.o. male.  The history is provided by the patient and medical records.  Cough      31 y.o. M here with scratchy throat and cough.  States has been ongoing for a few days now.  Reports some occasional burning in his chest with coughing and deep breathing but denies chest pain or shortness of breath.  No fever, chills, sweats.  Reports mom was recently diagnosed with pneumonia.  No other sick contacts.  No meds tried PTA.  History reviewed. No pertinent past medical history.  There are no active problems to display for this patient.   History reviewed. No pertinent surgical history.     Home Medications    Prior to Admission medications   Medication Sig Start Date End Date Taking? Authorizing Provider  carbamide peroxide (DEBROX) 6.5 % OTIC solution Place 5 drops into the left ear 2 (two) times daily. Patient not taking: Reported on 02/17/2017 01/18/17   Jaynie Crumble, PA-C  HYDROcodone-acetaminophen (NORCO/VICODIN) 5-325 MG tablet Take 1 tablet by mouth every 4 (four) hours as needed. 02/17/17   Tegeler, Canary Brim, MD  naproxen (NAPROSYN) 500 MG tablet Take 1 tablet (500 mg total) by mouth 2 (two) times daily. Patient not taking: Reported on 02/17/2017 01/18/17   Jaynie Crumble, PA-C    Family History No family history on file.  Social History Social History  Substance Use Topics  . Smoking status: Never Smoker  . Smokeless tobacco: Never Used  . Alcohol use Yes     Allergies   Patient has no known allergies.   Review of Systems Review of Systems  Respiratory: Positive for cough.   All other systems reviewed and are negative.    Physical Exam Updated Vital Signs BP 135/70 (BP Location: Left Arm)   Pulse 76    Temp 98.2 F (36.8 C) (Oral)   Resp 16   Ht 5\' 8"  (1.727 m)   Wt 93 kg (205 lb)   SpO2 98%   BMI 31.17 kg/m   Physical Exam  Constitutional: He is oriented to person, place, and time. He appears well-developed and well-nourished.  HENT:  Head: Normocephalic and atraumatic.  Right Ear: Tympanic membrane and ear canal normal.  Left Ear: Tympanic membrane and ear canal normal.  Nose: Nose normal.  Mouth/Throat: Uvula is midline, oropharynx is clear and moist and mucous membranes are normal. No oropharyngeal exudate, posterior oropharyngeal edema, posterior oropharyngeal erythema or tonsillar abscesses.  Eyes: Pupils are equal, round, and reactive to light. Conjunctivae and EOM are normal.  Neck: Normal range of motion.  Cardiovascular: Normal rate, regular rhythm and normal heart sounds.   Pulmonary/Chest: Effort normal and breath sounds normal. No respiratory distress. He has no wheezes.  Abdominal: Soft. Bowel sounds are normal.  Musculoskeletal: Normal range of motion.  Neurological: He is alert and oriented to person, place, and time.  Skin: Skin is warm and dry.  Psychiatric: He has a normal mood and affect.  Nursing note and vitals reviewed.    ED Treatments / Results  Labs (all labs ordered are listed, but only abnormal results are displayed) Labs Reviewed - No data to display  EKG  EKG Interpretation None       Radiology Dg  Chest 2 View  Result Date: 03/19/2017 CLINICAL DATA:  Cough, sore throat for 2 days. EXAM: CHEST  2 VIEW COMPARISON:  Chest radiograph February 16, 2017 FINDINGS: Cardiomediastinal silhouette is normal. No pleural effusions or focal consolidations. Trachea projects midline and there is no pneumothorax. Soft tissue planes and included osseous structures are non-suspicious. IMPRESSION: Stable negative chest. Electronically Signed   By: Awilda Metroourtnay  Bloomer M.D.   On: 03/19/2017 02:42    Procedures Procedures (including critical care  time)  Medications Ordered in ED Medications - No data to display   Initial Impression / Assessment and Plan / ED Course  I have reviewed the triage vital signs and the nursing notes.  Pertinent labs & imaging results that were available during my care of the patient were reviewed by me and considered in my medical decision making (see chart for details).  31 y.o. M here with cough and scratchy throat.  Mother sick with pneumonia recently.  States he was concerned for same.  Exam overall benign here without acute infectious findings.  VSS.  CXR negative.  Recommended OTC cough medications if needed.  Discharged home in stable condition.    Final Clinical Impressions(s) / ED Diagnoses   Final diagnoses:  Cough    New Prescriptions New Prescriptions   No medications on file     Garlon HatchetSanders, Valor Quaintance M, PA-C 03/19/17 0401    Gilda CreasePollina, Christopher J, MD 03/20/17 [phone]

## 2017-10-24 ENCOUNTER — Encounter (HOSPITAL_COMMUNITY): Payer: Self-pay | Admitting: Emergency Medicine

## 2017-10-24 ENCOUNTER — Emergency Department (HOSPITAL_COMMUNITY): Payer: Self-pay

## 2017-10-24 ENCOUNTER — Emergency Department (HOSPITAL_COMMUNITY)
Admission: EM | Admit: 2017-10-24 | Discharge: 2017-10-24 | Disposition: A | Discharge status: Home / Self Care | Payer: Self-pay | Attending: Emergency Medicine | Admitting: Emergency Medicine

## 2017-10-24 DIAGNOSIS — M25561 Pain in right knee: Secondary | ICD-10-CM | POA: Insufficient documentation

## 2017-10-24 DIAGNOSIS — M79604 Pain in right leg: Secondary | ICD-10-CM

## 2017-10-24 DIAGNOSIS — Z79899 Other long term (current) drug therapy: Secondary | ICD-10-CM | POA: Insufficient documentation

## 2017-10-24 DIAGNOSIS — R52 Pain, unspecified: Secondary | ICD-10-CM

## 2017-10-24 DIAGNOSIS — M79651 Pain in right thigh: Secondary | ICD-10-CM | POA: Insufficient documentation
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
## 2017-10-24 NOTE — ED Provider Notes (Signed)
[facility] EMERGENCY DEPARTMENT Provider Note   CSN: [id number] Arrival date & time: 10/24/17  0123     History   Chief Complaint Chief Complaint  Patient presents with  . Leg Pain    HPI Ronnie Rivera is a 32 y.o. male.  Patient presents to the emergency department with a chief complaint of right knee and right thigh pain.  He reports having a rod in his right thigh and screws in his right knee.  He states that he was working very aggressively this week, and feels like his hardware is come loose.  He reports moderate pain.  Denies fever, chills, or trauma.  He denies any other associated symptoms.   The history is provided by the patient. No language interpreter was used.    History reviewed. No pertinent past medical history.  There are no active problems to display for this patient.   History reviewed. No pertinent surgical history.      Home Medications    Prior to Admission medications   Medication Sig Start Date End Date Taking? Authorizing Provider  carbamide peroxide (DEBROX) 6.5 % OTIC solution Place 5 drops into the left ear 2 (two) times daily. Patient not taking: Reported on 02/17/2017 01/18/17   Jaynie Crumble, PA-C  HYDROcodone-acetaminophen (NORCO/VICODIN) 5-325 MG tablet Take 1 tablet by mouth every 4 (four) hours as needed. 02/17/17   Tegeler, Canary Brim, MD  naproxen (NAPROSYN) 500 MG tablet Take 1 tablet (500 mg total) by mouth 2 (two) times daily. Patient not taking: Reported on 02/17/2017 01/18/17   Jaynie Crumble, PA-C    Family History No family history on file.  Social History Social History   Tobacco Use  . Smoking status: Never Smoker  . Smokeless tobacco: Never Used  Substance Use Topics  . Alcohol use: Yes    Comment: occ  . Drug use: Yes    Types: Marijuana    Comment: occ     Allergies   Patient has no known allergies.   Review of Systems Review of Systems  All other systems reviewed and  are negative.    Physical Exam Updated Vital Signs BP (!) 123/91 (BP Location: Right Arm)   Pulse 64   Temp 97.8 F (36.6 C) (Oral)   Resp 18   Ht 5\' 9"  (1.753 m)   Wt 98 kg (216 lb)   SpO2 100%   BMI 31.90 kg/m   Physical Exam  Constitutional: He is oriented to person, place, and time. He appears well-developed and well-nourished.  HENT:  Head: Normocephalic and atraumatic.  Eyes: Conjunctivae and EOM are normal.  Neck: Normal range of motion.  Cardiovascular: Normal rate.  Pulmonary/Chest: Effort normal.  Abdominal: He exhibits no distension.  Musculoskeletal: Normal range of motion.  Neurological: He is alert and oriented to person, place, and time.  Skin: Skin is dry.  Psychiatric: He has a normal mood and affect. His behavior is normal. Judgment and thought content normal.  Nursing note and vitals reviewed.    ED Treatments / Results  Labs (all labs ordered are listed, but only abnormal results are displayed) Labs Reviewed - No data to display  EKG None  Radiology Dg Knee Complete 4 Views Right  Result Date: 10/24/2017 CLINICAL DATA:  Pain right femur.  Hardware issue. EXAM: RIGHT KNEE - COMPLETE 4+ VIEW COMPARISON:  02/17/2017 FINDINGS: Chronic fracture distal femur has healed. Locking intramedullary rod in good position. No acute fracture Calcification in the medial collateral ligament  is unchanged due to chronic injury. Joint spaces are preserved. Calcific loose bodies overlying the joint space centrally unchanged from the prior study. IMPRESSION: No acute abnormality and no change from prior study. Electronically Signed   By: Marlan Palauharles  Clark M.D.   On: 10/24/2017 07:42   Dg Femur, Min 2 Views Right  Result Date: 10/24/2017 CLINICAL DATA:  Right femur pain EXAM: RIGHT FEMUR 2 VIEWS COMPARISON:  None. FINDINGS: Chronic healed fracture distal femur. No acute fracture. Locking intramedullary rod in satisfactory position. No evidence of hardware loosening.  IMPRESSION: Chronic healed fracture distal femur.  No acute abnormality. Electronically Signed   By: Marlan Palauharles  Clark M.D.   On: 10/24/2017 07:43    Procedures Procedures (including critical care time)  Medications Ordered in ED Medications - No data to display   Initial Impression / Assessment and Plan / ED Course  I have reviewed the triage vital signs and the nursing notes.  Pertinent labs & imaging results that were available during my care of the patient were reviewed by me and considered in my medical decision making (see chart for details).  Clinical Course as of Oct 26 647  Fri Oct 24, 2017  0657 Plan: x-rays to evaluate for loosening of hardware/    [EH]    Clinical Course User Index [EH] Cristina GongHammond, Elizabeth W, PA-C    [facility], will continue care.  Final Clinical Impressions(s) / ED Diagnoses   Final diagnoses:  Right leg pain    ED Discharge Orders    None       Roxy HorsemanBrowning, Railee Bonillas, PA-C 10/25/17 [id number]    Gilda CreasePollina, Christopher J, MD 10/25/17 [phone]

## 2017-10-24 NOTE — Discharge Instructions (Signed)

## 2017-10-24 NOTE — ED Provider Notes (Signed)
Ronnie Rivera presents today for evaluation of leg pain.  He was initially seen by [name], PA-C, please see his note for full H&P.  Briefly patient is here for right leg pain.  He has a history of hardware placement in the right leg and is concerned that he may have some hardware loosening.  He is reportedly been self-medicating his pain with marijuana.  Plan is to follow-up on his x-rays.  Patient evaluated, resting comfortably no obvious distress.  He is awake and alert, reports that his leg is feeling better.  X-rays showed no hardware loosening or other acute abnormalities.  Patient discharged home with over-the-counter pain medicine.  Dg Knee Complete 4 Views Right  Result Date: 10/24/2017 CLINICAL DATA:  Pain right femur.  Hardware issue. EXAM: RIGHT KNEE - COMPLETE 4+ VIEW COMPARISON:  02/17/2017 FINDINGS: Chronic fracture distal femur has healed. Locking intramedullary rod in good position. No acute fracture Calcification in the medial collateral ligament is unchanged due to chronic injury. Joint spaces are preserved. Calcific loose bodies overlying the joint space centrally unchanged from the prior study. IMPRESSION: No acute abnormality and no change from prior study. Electronically Signed   By: Marlan Palau M.D.   On: 10/24/2017 07:42   Dg Femur, Min 2 Views Right  Result Date: 10/24/2017 CLINICAL DATA:  Right femur pain EXAM: RIGHT FEMUR 2 VIEWS COMPARISON:  None. FINDINGS: Chronic healed fracture distal femur. No acute fracture. Locking intramedullary rod in satisfactory position. No evidence of hardware loosening. IMPRESSION: Chronic healed fracture distal femur.  No acute abnormality. Electronically Signed   By: Marlan Palau M.D.   On: 10/24/2017 07:43    Clinical Course as of Oct 25 754  Fri Oct 24, 2017  5621 Plan: x-rays to evaluate for loosening of hardware/    [EH]    Clinical Course User Index [EH] Lottie Rater,  [place] 10/24/17 3086    Gilda Crease, MD 10/24/17 2300

## 2018-05-08 ENCOUNTER — Encounter (HOSPITAL_COMMUNITY): Payer: Self-pay

## 2018-05-08 ENCOUNTER — Emergency Department (HOSPITAL_COMMUNITY)
Admission: EM | Admit: 2018-05-08 | Discharge: 2018-05-08 | Disposition: A | Discharge status: Home / Self Care | Payer: Self-pay | Attending: Emergency Medicine | Admitting: Emergency Medicine

## 2018-05-08 ENCOUNTER — Other Ambulatory Visit: Payer: Self-pay

## 2018-05-08 ENCOUNTER — Emergency Department (HOSPITAL_COMMUNITY): Payer: Self-pay

## 2018-05-08 DIAGNOSIS — W450XXA Nail entering through skin, initial encounter: Secondary | ICD-10-CM | POA: Insufficient documentation

## 2018-05-08 DIAGNOSIS — Y999 Unspecified external cause status: Secondary | ICD-10-CM | POA: Insufficient documentation

## 2018-05-08 DIAGNOSIS — Z23 Encounter for immunization: Secondary | ICD-10-CM | POA: Insufficient documentation

## 2018-05-08 DIAGNOSIS — Y929 Unspecified place or not applicable: Secondary | ICD-10-CM | POA: Insufficient documentation

## 2018-05-08 DIAGNOSIS — S91331A Puncture wound without foreign body, right foot, initial encounter: Secondary | ICD-10-CM

## 2018-05-08 DIAGNOSIS — Y9301 Activity, walking, marching and hiking: Secondary | ICD-10-CM | POA: Insufficient documentation

## 2018-05-08 MED ORDER — TETANUS-DIPHTH-ACELL PERTUSSIS 5-2.5-18.5 LF-MCG/0.5 IM SUSP
0.5000 mL | Freq: Once | INTRAMUSCULAR | Status: AC
  Administered 2018-05-08: 0.5 mL via INTRAMUSCULAR
  Filled 2018-05-08: qty 0.5

## 2018-05-08 MED ORDER — CIPROFLOXACIN HCL 500 MG PO TABS: 500.0000 mg | ORAL_TABLET | Freq: Two times a day (BID) | ORAL | 0 refills | Status: DC

## 2018-05-08 MED ORDER — IBUPROFEN 200 MG PO TABS
600.0000 mg | ORAL_TABLET | Freq: Once | ORAL | Status: AC
Start: 2018-05-08 — End: 2018-05-08
  Administered 2018-05-08: 600 mg via ORAL
  Filled 2018-05-08: qty 3

## 2018-05-08 NOTE — ED Triage Notes (Signed)
Pt states that he stepped on a nail yesterday with his right foot on the ball of his foot. Unsure of last tetanus.

## 2018-05-08 NOTE — ED Provider Notes (Signed)
[facility] Provider Note   CSN: [id number] Arrival date & time: 05/08/18  0906     History   Chief Complaint Chief Complaint  Patient presents with  . Foot Pain    right    HPI Ronnie Rivera is a 32 y.o. male.  Ronnie Rivera is a 32 y.o. male who is otherwise healthy, presents to the emergency department for evaluation of puncture wound from a nail.  He reports that he stepped on a nail which went through the sole of his work boots yesterday evening and went into the ball of his right foot, the nail did not come through the top of his foot.  He reports a very small amount of bleeding immediately afterwards but no bleeding since then, the foot is tender to palpation but he has not noticed any drainage from the wound, small amount of redness but no warmth or swelling.  No fevers or chills.  Unsure when last tetanus vaccination was given.  No prior injury or surgery to the foot, no hardware in place.     History reviewed. No pertinent past medical history.  There are no active problems to display for this patient.   History reviewed. No pertinent surgical history.      Home Medications    Prior to Admission medications   Medication Sig Start Date End Date Taking? Authorizing Provider  carbamide peroxide (DEBROX) 6.5 % OTIC solution Place 5 drops into the left ear 2 (two) times daily. Patient not taking: Reported on 02/17/2017 01/18/17   Jaynie CrumbleKirichenko, Tatyana, PA-C  ciprofloxacin (CIPRO) 500 MG tablet Take 1 tablet (500 mg total) by mouth 2 (two) times daily. One po bid x 7 days 05/08/18   Dartha LodgeFord,  N, PA-C  HYDROcodone-acetaminophen (NORCO/VICODIN) 5-325 MG tablet Take 1 tablet by mouth every 4 (four) hours as needed. Patient not taking: Reported on 05/08/2018 02/17/17   Tegeler, Canary Brimhristopher J, MD  naproxen (NAPROSYN) 500 MG tablet Take 1 tablet (500 mg total) by mouth 2 (two) times daily. Patient not taking: Reported on 02/17/2017  01/18/17   Jaynie CrumbleKirichenko, Tatyana, PA-C    Family History No family history on file.  Social History Social History   Tobacco Use  . Smoking status: Never Smoker  . Smokeless tobacco: Never Used  Substance Use Topics  . Alcohol use: Yes    Comment: occ  . Drug use: Yes    Types: Marijuana    Comment: occ     Allergies   Patient has no known allergies.   Review of Systems Review of Systems  Constitutional: Negative for chills.  Musculoskeletal: Positive for arthralgias. Negative for joint swelling.  Skin: Positive for wound. Negative for color change and rash.  Neurological: Negative for weakness and numbness.     Physical Exam Updated Vital Signs BP 138/84 (BP Location: Left Arm)   Pulse 75   Temp 97.7 F (36.5 C) (Oral)   Resp 16   Ht 5\' 9"  (1.753 m)   Wt 93.4 kg   SpO2 100%   BMI 30.42 kg/m   Physical Exam Vitals signs and nursing note reviewed.  Constitutional:      General: He is not in acute distress.    Appearance: Normal appearance. He is well-developed. He is not diaphoretic.  HENT:     Head: Normocephalic and atraumatic.  Eyes:     General:        Right eye: No discharge.  Left eye: No discharge.  Pulmonary:     Effort: Pulmonary effort is normal. No respiratory distress.  Musculoskeletal:     Comments: There is a mild puncture wound to the undersurface of the ball of the foot slight surrounding erythema, no bleeding, no palpable fluctuance, no drainage.  The wound does not go through to the top of the foot, there is no bony tenderness.  2+ DP pulse and good capillary refill, normal sensation, 5/5 strength with dorsi and plantar flexion.  Skin:    General: Skin is warm and dry.     Capillary Refill: Capillary refill takes less than 2 seconds.  Neurological:     Mental Status: He is alert and oriented to person, place, and time.     Coordination: Coordination normal.  Psychiatric:        Mood and Affect: Mood normal.        Behavior:  Behavior normal.      ED Treatments / Results  Labs (all labs ordered are listed, but only abnormal results are displayed) Labs Reviewed - No data to display  EKG None  Radiology Dg Foot Complete Right  Result Date: 05/08/2018 CLINICAL DATA:  Stepped on nail yesterday with foot pain, initial encounter EXAM: RIGHT FOOT COMPLETE - 3+ VIEW COMPARISON:  None. FINDINGS: No acute fracture or dislocation is noted. No radiopaque foreign body is seen. No soft tissue changes are noted. IMPRESSION: No acute abnormality seen. Electronically Signed   By: Alcide Clever M.D.   On: 05/08/2018 10:36    Procedures Procedures (including critical care time)  Medications Ordered in ED Medications  ibuprofen (ADVIL,MOTRIN) tablet 600 mg (has no administration in time range)  Tdap (BOOSTRIX) injection 0.5 mL (has no administration in time range)     Initial Impression / Assessment and Plan / ED Course  I have reviewed the triage vital signs and the nursing notes.  Pertinent labs & imaging results that were available during my care of the patient were reviewed by me and considered in my medical decision making (see chart for details).  Patient presents for evaluation after he stepped on a nail yesterday evening.  There is a small puncture wound to the ball of his right foot that did not go through to the top of his foot.  The foot is neurovascularly intact, x-ray shows no evidence of bony abnormality or foreign body.  Tetanus updated here in the emergency department.  Patient was wearing rubber soled shoes so pseudomonal prophylaxis provided with prescription for Cipro.  No signs of infection or abscess at this time, wound was copiously irrigated.  At this time is stable for discharge home, signs of infection discussed and return precautions provided.  Final Clinical Impressions(s) / ED Diagnoses   Final diagnoses:  Puncture wound of right foot, initial encounter    ED Discharge Orders          Ordered    ciprofloxacin (CIPRO) 500 MG tablet  2 times daily     05/08/18 1118           Jodi Geralds [place], [place] 05/08/18 1141    Charlynne Pander, MD 05/08/18 [phone]

## 2018-05-08 NOTE — Discharge Instructions (Signed)
Your tetanus shot was updated today and your x-ray looks good.  Please take Cipro twice daily as directed to help prevent infection, monitor the site for redness, swelling, drainage or increasing pain as these are signs of worsening infection, if these occur return for reevaluation.  You may take Motrin and Tylenol as needed for pain.

## 2018-07-02 ENCOUNTER — Emergency Department (HOSPITAL_COMMUNITY)
Admission: EM | Admit: 2018-07-02 | Discharge: 2018-07-03 | Disposition: A | Discharge status: Home / Self Care | Payer: Self-pay | Attending: Emergency Medicine | Admitting: Emergency Medicine

## 2018-07-02 ENCOUNTER — Other Ambulatory Visit: Payer: Self-pay

## 2018-07-02 DIAGNOSIS — M79604 Pain in right leg: Secondary | ICD-10-CM | POA: Insufficient documentation

## 2018-07-02 NOTE — ED Triage Notes (Signed)
Patient wants [place] to remove the rods and screws in his right leg.

## 2018-07-03 ENCOUNTER — Emergency Department (HOSPITAL_COMMUNITY): Payer: Self-pay

## 2018-07-03 NOTE — ED Provider Notes (Signed)
[facility] Provider Note   CSN: [id number] Arrival date & time: 07/02/18  2250     History   Chief Complaint Chief Complaint  Patient presents with  . Leg Pain    right    HPI Ronnie Rivera is a 33 y.o. male.  Patient presents to the emergency department with a chief complaint of right leg pain.  He states that he has a rod and screws in his right femur.  He states that feels like they are loose.  He denies any traumatic injury.  States that he has not made an appointment with his orthopedic, but knows that he needs to.  Denies any fever or redness.  Denies any other associated symptoms.  The history is provided by the patient. No language interpreter was used.    No past medical history on file.  There are no active problems to display for this patient.   No past surgical history on file.      Home Medications    Prior to Admission medications   Medication Sig Start Date End Date Taking? Authorizing Provider  carbamide peroxide (DEBROX) 6.5 % OTIC solution Place 5 drops into the left ear 2 (two) times daily. Patient not taking: Reported on 02/17/2017 01/18/17   Jaynie Crumble, PA-C  ciprofloxacin (CIPRO) 500 MG tablet Take 1 tablet (500 mg total) by mouth 2 (two) times daily. One po bid x 7 days 05/08/18   Dartha Lodge, PA-C  HYDROcodone-acetaminophen (NORCO/VICODIN) 5-325 MG tablet Take 1 tablet by mouth every 4 (four) hours as needed. Patient not taking: Reported on 05/08/2018 02/17/17   Tegeler, Canary Brim, MD  naproxen (NAPROSYN) 500 MG tablet Take 1 tablet (500 mg total) by mouth 2 (two) times daily. Patient not taking: Reported on 02/17/2017 01/18/17   Jaynie Crumble, PA-C    Family History No family history on file.  Social History Social History   Tobacco Use  . Smoking status: Never Smoker  . Smokeless tobacco: Never Used  Substance Use Topics  . Alcohol use: Yes    Comment: occ  . Drug use: Yes     Types: Marijuana    Comment: occ     Allergies   Patient has no known allergies.   Review of Systems Review of Systems  Constitutional: Negative for fever.  Musculoskeletal: Positive for arthralgias.  Skin: Negative for color change, rash and wound.     Physical Exam Updated Vital Signs BP 132/71 (BP Location: Right Arm)   Pulse 83   Temp 98.3 F (36.8 C) (Oral)   Resp 20   Ht 5' 8.75" (1.746 m)   Wt 88.5 kg   SpO2 97%   BMI 29.01 kg/m   Physical Exam Vitals signs and nursing note reviewed.  Constitutional:      Appearance: He is well-developed.  HENT:     Head: Normocephalic and atraumatic.  Eyes:     Conjunctiva/sclera: Conjunctivae normal.  Neck:     Musculoskeletal: Normal range of motion.  Cardiovascular:     Rate and Rhythm: Normal rate.  Pulmonary:     Effort: Pulmonary effort is normal.  Abdominal:     General: There is no distension.  Musculoskeletal: Normal range of motion.     Comments: No palpable effusion, range of motion strength 5/5 in right lower extremity, no bony abnormality or deformity  Skin:    General: Skin is dry.  Neurological:     Mental Status: He is alert  and oriented to person, place, and time.  Psychiatric:        Behavior: Behavior normal.        Thought Content: Thought content normal.        Judgment: Judgment normal.      ED Treatments / Results  Labs (all labs ordered are listed, but only abnormal results are displayed) Labs Reviewed - No data to display  EKG None  Radiology Dg Femur Min 2 Views Right  Result Date: 07/03/2018 CLINICAL DATA:  Patient feels like femoral hardware is loose. No injury. EXAM: RIGHT FEMUR 2 VIEWS COMPARISON:  10/24/2017 FINDINGS: Old healed fracture deformity of the distal left femoral shaft. Intramedullary rod with 2 proximal and 3 distal locking screws. Components appear well seated without change in position or appearance since previous study. No evidence of acute fracture or  dislocation of the right femur. IMPRESSION: Old healed fracture deformity of the distal left femoral shaft with intramedullary rod. Components appear well seated. No acute bony abnormalities. Electronically Signed   By: Burman Nieves M.D.   On: 07/03/2018 01:33    Procedures Procedures (including critical care time)  Medications Ordered in ED Medications - No data to display   Initial Impression / Assessment and Plan / ED Course  I have reviewed the triage vital signs and the nursing notes.  Pertinent labs & imaging results that were available during my care of the patient were reviewed by me and considered in my medical decision making (see chart for details).     Patient with chronic right leg pain.  Has hardware in his right leg.  Requesting to have this removed.  Thinks that the hardware is loose.  No evidence of loosening on plain films.  Ambulating without difficulty.  Referred back to his orthopedic.  Final Clinical Impressions(s) / ED Diagnoses   Final diagnoses:  Right leg pain    ED Discharge Orders    None       [name], PA-C 07/03/18 Freddie Breech    Shaune Pollack, MD 07/04/18 [phone]

## 2018-07-03 NOTE — ED Notes (Signed)
Patient transported to X-ray 

## 2018-07-03 NOTE — ED Notes (Signed)
Patient verbalizes understanding of discharge instructions. Opportunity for questioning and answers were provided. Armband removed by staff, pt discharged from ED ambulatory.   

## 2018-09-17 NOTE — Progress Notes (Signed)
COVID Hotel Screening performed. Temperature, PHQ-9, and need for medical care and medications assessed. Patient reports no needs at this time  Amanii Snethen RN MSN 

## 2018-09-23 NOTE — Progress Notes (Signed)
COVID Hotel Screening performed. Temperature, PHQ-9, and need for medical care and medications assessed. No additional needs at this time.  Anastashia Westerfeld RN MSN 

## 2018-09-23 NOTE — Progress Notes (Unsigned)
COVID Hotel Screening performed. Temperature, PHQ-9, and need for medical care and medications assessed. No additional needs at this time.  Swanson Farnell RN MSN 

## 2019-03-12 ENCOUNTER — Emergency Department (HOSPITAL_COMMUNITY)
Admission: EM | Admit: 2019-03-12 | Discharge: 2019-03-12 | Disposition: A | Discharge status: Home / Self Care | Payer: Self-pay | Attending: Emergency Medicine | Admitting: Emergency Medicine

## 2019-03-12 ENCOUNTER — Other Ambulatory Visit: Payer: Self-pay

## 2019-03-12 DIAGNOSIS — R1084 Generalized abdominal pain: Secondary | ICD-10-CM | POA: Insufficient documentation

## 2019-03-12 LAB — URINALYSIS, ROUTINE W REFLEX MICROSCOPIC
Bilirubin Urine: NEGATIVE
Glucose, UA: NEGATIVE mg/dL
Hgb urine dipstick: NEGATIVE
Ketones, ur: NEGATIVE mg/dL
Leukocytes,Ua: NEGATIVE
Nitrite: NEGATIVE
Protein, ur: NEGATIVE mg/dL
Specific Gravity, Urine: 1.026 (ref 1.005–1.030)
pH: 7 (ref 5.0–8.0)

## 2019-03-12 LAB — CBC
HCT: 39.7 % (ref 39.0–52.0)
Hemoglobin: 13.5 g/dL (ref 13.0–17.0)
MCH: 31.6 pg (ref 26.0–34.0)
MCHC: 34 g/dL (ref 30.0–36.0)
MCV: 93 fL (ref 80.0–100.0)
Platelets: 208 10*3/uL (ref 150–400)
RBC: 4.27 MIL/uL (ref 4.22–5.81)
RDW: 11.5 % (ref 11.5–15.5)
WBC: 3.2 10*3/uL — ABNORMAL LOW (ref 4.0–10.5)
nRBC: 0 % (ref 0.0–0.2)

## 2019-03-12 LAB — COMPREHENSIVE METABOLIC PANEL
ALT: 24 U/L (ref 0–44)
AST: 24 U/L (ref 15–41)
Albumin: 4.2 g/dL (ref 3.5–5.0)
Alkaline Phosphatase: 62 U/L (ref 38–126)
Anion gap: 8 (ref 5–15)
BUN: 12 mg/dL (ref 6–20)
CO2: 24 mmol/L (ref 22–32)
Calcium: 8.6 mg/dL — ABNORMAL LOW (ref 8.9–10.3)
Chloride: 106 mmol/L (ref 98–111)
Creatinine, Ser: 0.98 mg/dL (ref 0.61–1.24)
GFR calc Af Amer: >60 mL/min (ref >60)
GFR calc non Af Amer: >60 mL/min (ref >60)
Glucose, Bld: 86 mg/dL (ref 70–99)
Potassium: 4 mmol/L (ref 3.5–5.1)
Sodium: 138 mmol/L (ref 135–145)
Total Bilirubin: 0.9 mg/dL (ref 0.3–1.2)
Total Protein: 6.4 g/dL — ABNORMAL LOW (ref 6.5–8.1)

## 2019-03-12 LAB — LIPASE, BLOOD: Lipase: 28 U/L (ref 11–51)

## 2019-03-12 MED ORDER — SODIUM CHLORIDE 0.9% FLUSH: 3.0000 mL | Freq: Once | INTRAVENOUS | Status: DC

## 2019-03-12 NOTE — ED Provider Notes (Signed)
[facility] EMERGENCY DEPARTMENT Provider Note   CSN: [id number] Arrival date & time: 03/12/19  1447     History   Chief Complaint Chief Complaint  Patient presents with  . Abdominal Pain    HPI Ronnie Rivera is a 33 y.o. male.     The history is provided by the patient. No language interpreter was used.  Abdominal Pain    33 year old male presenting for evaluation of abdominal pain.  Patient report after lunch today he developed abdominal pain.  States that he ate some frozen sandwich at his workplace and shortly after he endorsed diffuse abdominal cramping with diarrhea.  Pain was initially intense however after having a bowel movement and waiting in the ER, his symptoms has mostly subsided.  There is no associated fever or chills no nausea or vomiting no chest pain trouble breathing or coughing no dysuria.  His pain is minimal at this time.  No dysuria.  No specific treatment tried.  No past medical history on file.  There are no active problems to display for this patient.   No past surgical history on file.      Home Medications    Prior to Admission medications   Medication Sig Start Date End Date Taking? Authorizing Provider  carbamide peroxide (DEBROX) 6.5 % OTIC solution Place 5 drops into the left ear 2 (two) times daily. Patient not taking: Reported on 02/17/2017 01/18/17   Jaynie Crumble, PA-C  ciprofloxacin (CIPRO) 500 MG tablet Take 1 tablet (500 mg total) by mouth 2 (two) times daily. One po bid x 7 days 05/08/18   Dartha Lodge, PA-C  HYDROcodone-acetaminophen (NORCO/VICODIN) 5-325 MG tablet Take 1 tablet by mouth every 4 (four) hours as needed. Patient not taking: Reported on 05/08/2018 02/17/17   Tegeler, Canary Brim, MD  naproxen (NAPROSYN) 500 MG tablet Take 1 tablet (500 mg total) by mouth 2 (two) times daily. Patient not taking: Reported on 02/17/2017 01/18/17   Jaynie Crumble, PA-C    Family History No family  history on file.  Social History Social History   Tobacco Use  . Smoking status: Never Smoker  . Smokeless tobacco: Never Used  Substance Use Topics  . Alcohol use: Yes    Comment: occ  . Drug use: Yes    Types: Marijuana    Comment: occ     Allergies   Patient has no known allergies.   Review of Systems Review of Systems  Gastrointestinal: Positive for abdominal pain.  All other systems reviewed and are negative.    Physical Exam Updated Vital Signs BP (!) 148/78 (BP Location: Left Arm)   Pulse 62   Temp 98.6 F (37 C) (Oral)   Resp 16   SpO2 98%   Physical Exam Vitals signs and nursing note reviewed.  Constitutional:      General: He is not in acute distress.    Appearance: He is well-developed.  HENT:     Head: Atraumatic.  Eyes:     Conjunctiva/sclera: Conjunctivae normal.  Neck:     Musculoskeletal: Neck supple.  Cardiovascular:     Rate and Rhythm: Normal rate and regular rhythm.     Heart sounds: Normal heart sounds.  Pulmonary:     Effort: Pulmonary effort is normal.     Breath sounds: Normal breath sounds.  Abdominal:     General: Abdomen is flat. Bowel sounds are normal.     Tenderness: There is no abdominal tenderness. There is no guarding  or rebound. Negative signs include Murphy's sign, Rovsing's sign and McBurney's sign.  Skin:    Findings: No rash.  Neurological:     Mental Status: He is alert.  Psychiatric:        Mood and Affect: Mood normal.      ED Treatments / Results  Labs (all labs ordered are listed, but only abnormal results are displayed) Labs Reviewed  COMPREHENSIVE METABOLIC PANEL - Abnormal; Notable for the following components:      Result Value   Calcium 8.6 (*)    Total Protein 6.4 (*)    All other components within normal limits  CBC - Abnormal; Notable for the following components:   WBC 3.2 (*)    All other components within normal limits  LIPASE, BLOOD  URINALYSIS, ROUTINE W REFLEX MICROSCOPIC    EKG  None  Radiology No results found.  Procedures Procedures (including critical care time)  Medications Ordered in ED Medications  sodium chloride flush (NS) 0.9 % injection 3 mL (has no administration in time range)     Initial Impression / Assessment and Plan / ED Course  I have reviewed the triage vital signs and the nursing notes.  Pertinent labs & imaging results that were available during my care of the patient were reviewed by me and considered in my medical decision making (see chart for details).        BP (!) 148/78 (BP Location: Left Arm)   Pulse 62   Temp 98.6 F (37 C) (Oral)   Resp 16   SpO2 98%    Final Clinical Impressions(s) / ED Diagnoses   Final diagnoses:  Generalized abdominal pain    ED Discharge Orders    None     4:26 PM Patient report diffuse abdominal discomfort after eating lunch today however after having a bowel movement with some loose stools, he felt much better.  On exam, abdomen is soft nontender.  Labs are reassuring.  At this time, no concerning feature requiring advanced imaging.  Patient stable for discharge.  Return precaution discussed.   Domenic Moras, PA-C 03/12/19 1632    Julianne Rice, MD 03/12/19 2228

## 2019-03-12 NOTE — ED Notes (Signed)
Patient Alert and oriented to baseline. Stable and ambulatory to baseline. Patient verbalized understanding of the discharge instructions.  Patient belongings were taken by the patient.   

## 2019-03-12 NOTE — ED Triage Notes (Signed)
abd pain that started today had x1 episode of diarrhea

## 2019-06-22 ENCOUNTER — Emergency Department (HOSPITAL_COMMUNITY)
Admission: EM | Admit: 2019-06-22 | Discharge: 2019-06-22 | Disposition: A | Discharge status: Home / Self Care | Payer: HRSA Program | Attending: Emergency Medicine | Admitting: Emergency Medicine

## 2019-06-22 ENCOUNTER — Other Ambulatory Visit: Payer: Self-pay

## 2019-06-22 ENCOUNTER — Encounter (HOSPITAL_COMMUNITY): Payer: Self-pay | Admitting: Student

## 2019-06-22 DIAGNOSIS — Z20822 Contact with and (suspected) exposure to covid-19: Secondary | ICD-10-CM | POA: Diagnosis not present

## 2019-06-22 DIAGNOSIS — R197 Diarrhea, unspecified: Secondary | ICD-10-CM | POA: Diagnosis not present

## 2019-06-22 NOTE — ED Triage Notes (Signed)
Pt reports diarrhea since Thursday. Job is requesting covid test. Denies cough or fever.

## 2019-06-22 NOTE — Discharge Instructions (Signed)
We will call you if covid 19 testing results are positive. If positive follow attached quarantine instructions.   Return to the ED for new or worsening symptoms or any other concerns.

## 2019-06-22 NOTE — ED Provider Notes (Signed)
[facility] EMERGENCY DEPARTMENT Provider Note   CSN: [id number] Arrival date & time: 06/22/19  1426     History Chief Complaint  Patient presents with  . Diarrhea    Ronnie Rivera is a 34 y.o. male without significant past medical hx who presents to the ED for covid testing secondary to diarrhea @ the request of his work. Patient states that he had a few episodes of diarrhea within the past 5 days, was not bloody or mucousy, has since resolved. He has a symptom check list he has to fill out at work, given he had diarrhea he was sent for covid testing.  He denies any other symptoms.  He denies fever, chills, URI symptoms, cough, dyspnea, nausea, vomiting, abdominal pain, melena, or hematochezia.  He denies recent foreign travel or antibiotic use.  HPI     No past medical history on file.  There are no problems to display for this patient.   No past surgical history on file.     No family history on file.  Social History   Tobacco Use  . Smoking status: Never Smoker  . Smokeless tobacco: Never Used  Substance Use Topics  . Alcohol use: Yes    Comment: occ  . Drug use: Yes    Types: Marijuana    Comment: occ    Home Medications Prior to Admission medications   Medication Sig Start Date End Date Taking? Authorizing Provider  ciprofloxacin (CIPRO) 500 MG tablet Take 1 tablet (500 mg total) by mouth 2 (two) times daily. One po bid x 7 days 05/08/18   Jacqlyn Larsen, PA-C    Allergies    Patient has no known allergies.  Review of Systems   Review of Systems  Constitutional: Negative for chills and fever.  HENT: Negative for congestion, ear pain and sore throat.   Respiratory: Negative for cough and shortness of breath.   Cardiovascular: Negative for chest pain.  Gastrointestinal: Positive for diarrhea (Resolved at present). Negative for abdominal pain, anal bleeding, blood in stool, constipation, nausea and vomiting.  Genitourinary: Negative  for dysuria.  Neurological: Negative for syncope.    Physical Exam Updated Vital Signs BP 129/65   Pulse 79   Temp 98.4 F (36.9 C) (Oral)   Resp 14   SpO2 97%   Physical Exam Vitals and nursing note reviewed.  Constitutional:      General: He is not in acute distress.    Appearance: He is well-developed. He is not toxic-appearing.  HENT:     Head: Normocephalic and atraumatic.  Eyes:     General:        Right eye: No discharge.        Left eye: No discharge.     Conjunctiva/sclera: Conjunctivae normal.  Cardiovascular:     Rate and Rhythm: Normal rate and regular rhythm.  Pulmonary:     Effort: Pulmonary effort is normal. No respiratory distress.     Breath sounds: Normal breath sounds. No wheezing, rhonchi or rales.  Abdominal:     General: There is no distension.     Palpations: Abdomen is soft.     Tenderness: There is no abdominal tenderness. There is no guarding or rebound.  Musculoskeletal:     Cervical back: Neck supple.  Skin:    General: Skin is warm and dry.     Findings: No rash.  Neurological:     Mental Status: He is alert.     Comments: Clear  speech.   Psychiatric:        Behavior: Behavior normal.     ED Results / Procedures / Treatments   Labs (all labs ordered are listed, but only abnormal results are displayed) Labs Reviewed - No data to display  EKG None  Radiology No results found.  Procedures Procedures (including critical care time)  Medications Ordered in ED Medications - No data to display  ED Course  I have reviewed the triage vital signs and the nursing notes.  Pertinent labs & imaging results that were available during my care of the patient were reviewed by me and considered in my medical decision making (see chart for details).    Ronnie Rivera was evaluated in Emergency Department on 06/22/2019 for the symptoms described in the history of present illness. He/she was evaluated in the context of the global COVID-19  pandemic, which necessitated consideration that the patient might be at risk for infection with the SARS-CoV-2 virus that causes COVID-19. Institutional protocols and algorithms that pertain to the evaluation of patients at risk for COVID-19 are in a state of rapid change based on information released by regulatory bodies including the CDC and federal and state organizations. These policies and algorithms were followed during the patient's care in the ED.  MDM Rules/Calculators/A&P                      Patient presents to the ED requesting covid testing due to work protocol secondary to diarrhea which is now resolved. No blood/mucous. No recent travel or abx. Abdomen nontender w/o peritoneal signs. Patient tolerating PO in the ED. Will obtain covid testing and discharge home. I discussed plan and return precautions with the patient. Provided opportunity for questions, patient confirmed understanding and is in agreement with plan.   Final Clinical Impression(s) / ED Diagnoses Final diagnoses:  Diarrhea, unspecified type    Rx / DC Orders ED Discharge Orders    None       Cherly Anderson, PA-C 06/22/19 1617    Milagros Loll, MD 06/22/19 2203

## 2019-06-23 LAB — NOVEL CORONAVIRUS, NAA (HOSP ORDER, SEND-OUT TO REF LAB; TAT 18-24 HRS): SARS-CoV-2, NAA: NOT DETECTED

## 2019-07-22 DIAGNOSIS — M222X1 Patellofemoral disorders, right knee: Secondary | ICD-10-CM | POA: Insufficient documentation

## 2019-07-22 HISTORY — DX: Patellofemoral disorders, right knee: M22.2X1

## 2019-11-16 ENCOUNTER — Other Ambulatory Visit: Payer: Self-pay

## 2019-11-16 ENCOUNTER — Emergency Department (HOSPITAL_COMMUNITY): Payer: Self-pay

## 2019-11-16 ENCOUNTER — Observation Stay (HOSPITAL_COMMUNITY)
Admission: EM | Admit: 2019-11-16 | Discharge: 2019-11-17 | Disposition: A | Discharge status: Home / Self Care | Payer: Self-pay | Attending: General Surgery | Admitting: General Surgery

## 2019-11-16 DIAGNOSIS — S0101XA Laceration without foreign body of scalp, initial encounter: Secondary | ICD-10-CM

## 2019-11-16 DIAGNOSIS — S0291XA Unspecified fracture of skull, initial encounter for closed fracture: Secondary | ICD-10-CM

## 2019-11-16 DIAGNOSIS — S0102XA Laceration with foreign body of scalp, initial encounter: Principal | ICD-10-CM | POA: Insufficient documentation

## 2019-11-16 DIAGNOSIS — Z20822 Contact with and (suspected) exposure to covid-19: Secondary | ICD-10-CM | POA: Insufficient documentation

## 2019-11-16 DIAGNOSIS — S020XXA Fracture of vault of skull, initial encounter for closed fracture: Secondary | ICD-10-CM | POA: Insufficient documentation

## 2019-11-16 DIAGNOSIS — T794XXA Traumatic shock, initial encounter: Secondary | ICD-10-CM | POA: Insufficient documentation

## 2019-11-16 DIAGNOSIS — S0191XA Laceration without foreign body of unspecified part of head, initial encounter: Secondary | ICD-10-CM

## 2019-11-16 HISTORY — DX: Laceration without foreign body of scalp, initial encounter: S01.01XA

## 2019-11-16 LAB — CBC
HCT: 43.4 % (ref 39.0–52.0)
HCT: 36.1 % — ABNORMAL LOW (ref 39.0–52.0)
Hemoglobin: 14.5 g/dL (ref 13.0–17.0)
Hemoglobin: 12 g/dL — ABNORMAL LOW (ref 13.0–17.0)
MCH: 30.9 pg (ref 26.0–34.0)
MCH: 30.6 pg (ref 26.0–34.0)
MCHC: 33.4 g/dL (ref 30.0–36.0)
MCHC: 33.2 g/dL (ref 30.0–36.0)
MCV: 92.5 fL (ref 80.0–100.0)
MCV: 92.1 fL (ref 80.0–100.0)
Platelets: 302 10*3/uL (ref 150–400)
Platelets: 181 10*3/uL (ref 150–400)
RBC: 4.69 MIL/uL (ref 4.22–5.81)
RBC: 3.92 MIL/uL — ABNORMAL LOW (ref 4.22–5.81)
RDW: 11.9 % (ref 11.5–15.5)
RDW: 12.4 % (ref 11.5–15.5)
WBC: 5.7 10*3/uL (ref 4.0–10.5)
WBC: 8.1 10*3/uL (ref 4.0–10.5)
nRBC: 0 % (ref 0.0–0.2)
nRBC: 0 % (ref 0.0–0.2)

## 2019-11-16 LAB — I-STAT CHEM 8, ED
BUN: 16 mg/dL (ref 6–20)
Calcium, Ion: 1.13 mmol/L — ABNORMAL LOW (ref 1.15–1.40)
Chloride: 103 mmol/L (ref 98–111)
Creatinine, Ser: 1.4 mg/dL — ABNORMAL HIGH (ref 0.61–1.24)
Glucose, Bld: 171 mg/dL — ABNORMAL HIGH (ref 70–99)
HCT: 43 % (ref 39.0–52.0)
Hemoglobin: 14.6 g/dL (ref 13.0–17.0)
Potassium: 3.6 mmol/L (ref 3.5–5.1)
Sodium: 142 mmol/L (ref 135–145)
TCO2: 22 mmol/L (ref 22–32)

## 2019-11-16 LAB — COMPREHENSIVE METABOLIC PANEL
ALT: 81 U/L — ABNORMAL HIGH (ref 0–44)
AST: 38 U/L (ref 15–41)
Albumin: 4.9 g/dL (ref 3.5–5.0)
Alkaline Phosphatase: 81 U/L (ref 38–126)
Anion gap: 19 — ABNORMAL HIGH (ref 5–15)
BUN: 14 mg/dL (ref 6–20)
CO2: 17 mmol/L — ABNORMAL LOW (ref 22–32)
Calcium: 9.4 mg/dL (ref 8.9–10.3)
Chloride: 103 mmol/L (ref 98–111)
Creatinine, Ser: 1.53 mg/dL — ABNORMAL HIGH (ref 0.61–1.24)
GFR calc Af Amer: >60 mL/min (ref >60)
GFR calc non Af Amer: 59 mL/min — ABNORMAL LOW (ref >60)
Glucose, Bld: 176 mg/dL — ABNORMAL HIGH (ref 70–99)
Potassium: 3.5 mmol/L (ref 3.5–5.1)
Sodium: 139 mmol/L (ref 135–145)
Total Bilirubin: 0.9 mg/dL (ref 0.3–1.2)
Total Protein: 7.8 g/dL (ref 6.5–8.1)

## 2019-11-16 LAB — SARS CORONAVIRUS 2 BY RT PCR (HOSPITAL ORDER, PERFORMED IN ~~LOC~~ HOSPITAL LAB): SARS Coronavirus 2: NEGATIVE

## 2019-11-16 LAB — URINALYSIS, ROUTINE W REFLEX MICROSCOPIC
Bacteria, UA: NONE SEEN
Bilirubin Urine: NEGATIVE
Glucose, UA: NEGATIVE mg/dL
Hgb urine dipstick: NEGATIVE
Ketones, ur: 20 mg/dL — AB
Leukocytes,Ua: NEGATIVE
Nitrite: NEGATIVE
Protein, ur: 30 mg/dL — AB
Specific Gravity, Urine: 1.027 (ref 1.005–1.030)
pH: 6 (ref 5.0–8.0)

## 2019-11-16 LAB — ETHANOL: Alcohol, Ethyl (B): <10 mg/dL (ref <10)

## 2019-11-16 LAB — PROTIME-INR
INR: 1.1 (ref 0.8–1.2)
Prothrombin Time: 13.7 seconds (ref 11.4–15.2)

## 2019-11-16 LAB — ABO/RH: ABO/RH(D): O POS

## 2019-11-16 LAB — LACTIC ACID, PLASMA: Lactic Acid, Venous: 6 mmol/L (ref 0.5–1.9)

## 2019-11-16 LAB — HIV ANTIBODY (ROUTINE TESTING W REFLEX): HIV Screen 4th Generation wRfx: NONREACTIVE

## 2019-11-16 MED ORDER — MORPHINE SULFATE (PF) 2 MG/ML IV SOLN: 1.0000 mg | INTRAVENOUS | Status: DC | PRN

## 2019-11-16 MED ORDER — CEPHALEXIN 250 MG PO CAPS
500.0000 mg | ORAL_CAPSULE | Freq: Two times a day (BID) | ORAL | Status: DC
  Administered 2019-11-16: 500 mg via ORAL
  Filled 2019-11-16: qty 2

## 2019-11-16 MED ORDER — SODIUM CHLORIDE 0.9 % IV SOLN
INTRAVENOUS | Status: AC | PRN
  Administered 2019-11-16: 1000 mL via INTRAVENOUS

## 2019-11-16 MED ORDER — SODIUM CHLORIDE 0.9 % IV SOLN: INTRAVENOUS | Status: DC

## 2019-11-16 MED ORDER — ACETAMINOPHEN 500 MG PO TABS
1000.0000 mg | ORAL_TABLET | Freq: Four times a day (QID) | ORAL | Status: DC
  Administered 2019-11-16 – 2019-11-17 (×3): 1000 mg via ORAL
  Filled 2019-11-16 (×3): qty 2

## 2019-11-16 MED ORDER — CEFAZOLIN SODIUM-DEXTROSE 2-4 GM/100ML-% IV SOLN
2.0000 g | Freq: Once | INTRAVENOUS | Status: AC
  Administered 2019-11-16: 2 g via INTRAVENOUS
  Filled 2019-11-16: qty 100

## 2019-11-16 MED ORDER — DOCUSATE SODIUM 100 MG PO CAPS: 100.0000 mg | ORAL_CAPSULE | Freq: Two times a day (BID) | ORAL | Status: DC

## 2019-11-16 MED ORDER — TRANEXAMIC ACID-NACL 1000-0.7 MG/100ML-% IV SOLN
1000.0000 mg | Freq: Once | INTRAVENOUS | Status: AC
  Administered 2019-11-16: 1000 mg via INTRAVENOUS
  Filled 2019-11-16: qty 100

## 2019-11-16 MED ORDER — MORPHINE SULFATE (PF) 4 MG/ML IV SOLN: 4.0000 mg | INTRAVENOUS | Status: DC | PRN

## 2019-11-16 MED ORDER — OXYCODONE HCL 5 MG PO TABS
10.0000 mg | ORAL_TABLET | ORAL | Status: DC | PRN
  Administered 2019-11-16 – 2019-11-17 (×2): 10 mg via ORAL
  Filled 2019-11-16 (×2): qty 2

## 2019-11-16 MED ORDER — TETANUS-DIPHTH-ACELL PERTUSSIS 5-2.5-18.5 LF-MCG/0.5 IM SUSP
0.5000 mL | Freq: Once | INTRAMUSCULAR | Status: AC
  Administered 2019-11-16: 0.5 mL via INTRAMUSCULAR
  Filled 2019-11-16: qty 0.5

## 2019-11-16 MED ORDER — LACTATED RINGERS IV BOLUS
1000.0000 mL | Freq: Once | INTRAVENOUS | Status: AC
  Administered 2019-11-16: 1000 mL via INTRAVENOUS

## 2019-11-16 MED ORDER — METOPROLOL TARTRATE 5 MG/5ML IV SOLN: 5.0000 mg | Freq: Four times a day (QID) | INTRAVENOUS | Status: DC | PRN

## 2019-11-16 MED ORDER — MORPHINE SULFATE (PF) 2 MG/ML IV SOLN: 2.0000 mg | INTRAVENOUS | Status: DC | PRN

## 2019-11-16 MED ORDER — ONDANSETRON HCL 4 MG/2ML IJ SOLN
INTRAMUSCULAR | Status: AC
  Administered 2019-11-16: 4 mg
  Filled 2019-11-16: qty 2

## 2019-11-16 MED ORDER — TETANUS-DIPHTH-ACELL PERTUSSIS 5-2.5-18.5 LF-MCG/0.5 IM SUSP: 0.5000 mL | Freq: Once | INTRAMUSCULAR | Status: DC

## 2019-11-16 MED ORDER — TRANEXAMIC ACID 1000 MG/10ML IV SOLN
1000.0000 mg | Freq: Once | INTRAVENOUS | Status: AC
  Administered 2019-11-16: 1000 mg via INTRAVENOUS
  Filled 2019-11-16: qty 10

## 2019-11-16 MED ORDER — ONDANSETRON HCL 4 MG/2ML IJ SOLN: 4.0000 mg | Freq: Four times a day (QID) | INTRAMUSCULAR | Status: DC | PRN

## 2019-11-16 MED ORDER — OXYCODONE HCL 5 MG PO TABS: 5.0000 mg | ORAL_TABLET | ORAL | Status: DC | PRN

## 2019-11-16 MED ORDER — ONDANSETRON 4 MG PO TBDP: 4.0000 mg | ORAL_TABLET | Freq: Four times a day (QID) | ORAL | Status: DC | PRN
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
## 2019-11-17 LAB — CBC
HCT: 33.2 % — ABNORMAL LOW (ref 39.0–52.0)
Hemoglobin: 11.2 g/dL — ABNORMAL LOW (ref 13.0–17.0)
MCH: 31 pg (ref 26.0–34.0)
MCHC: 33.7 g/dL (ref 30.0–36.0)
MCV: 92 fL (ref 80.0–100.0)
Platelets: 179 10*3/uL (ref 150–400)
RBC: 3.61 MIL/uL — ABNORMAL LOW (ref 4.22–5.81)
RDW: 12.2 % (ref 11.5–15.5)
WBC: 6.9 10*3/uL (ref 4.0–10.5)
nRBC: 0 % (ref 0.0–0.2)

## 2019-11-17 LAB — BASIC METABOLIC PANEL
Anion gap: 9 (ref 5–15)
BUN: 8 mg/dL (ref 6–20)
CO2: 23 mmol/L (ref 22–32)
Calcium: 8.2 mg/dL — ABNORMAL LOW (ref 8.9–10.3)
Chloride: 105 mmol/L (ref 98–111)
Creatinine, Ser: 1.04 mg/dL (ref 0.61–1.24)
GFR calc Af Amer: >60 mL/min (ref >60)
GFR calc non Af Amer: >60 mL/min (ref >60)
Glucose, Bld: 99 mg/dL (ref 70–99)
Potassium: 3.8 mmol/L (ref 3.5–5.1)
Sodium: 137 mmol/L (ref 135–145)

## 2019-11-17 LAB — TYPE AND SCREEN
ABO/RH(D): O POS
Antibody Screen: NEGATIVE
Unit division: 0

## 2019-11-17 LAB — BPAM RBC
Blood Product Expiration Date: 202107112359
ISSUE DATE / TIME: 202106221109
Unit Type and Rh: 5100

## 2019-11-17 MED ORDER — IRON 325 (65 FE) MG PO TABS: 1.0000 | ORAL_TABLET | Freq: Every day | ORAL | Status: DC

## 2019-11-17 MED ORDER — CEPHALEXIN 500 MG PO CAPS: 500.0000 mg | ORAL_CAPSULE | Freq: Two times a day (BID) | ORAL | 0 refills | Status: AC

## 2019-11-17 MED ORDER — OXYCODONE HCL 5 MG PO TABS: 5.0000 mg | ORAL_TABLET | Freq: Four times a day (QID) | ORAL | 0 refills | Status: DC | PRN

## 2019-11-17 MED ORDER — ACETAMINOPHEN 500 MG PO TABS: 1000.0000 mg | ORAL_TABLET | Freq: Four times a day (QID) | ORAL | Status: DC | PRN

## 2019-11-17 MED FILL — oxyCODONE HCL 5 MG TABS: 5 | 3 days supply | Qty: 20 | Fill #0

## 2019-11-17 MED FILL — CEPHALEXIN 500 MG CAPS: 500 | 6 days supply | Qty: 12 | Fill #0
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
## 2019-11-17 NOTE — ED Notes (Signed)
Report given to Karen RN

## 2019-11-17 NOTE — ED Notes (Signed)
Pt has been changed into disposable scrubs, IV removed, V/S updated (with temp), & breakfast given.

## 2019-11-17 NOTE — Discharge Instructions (Signed)
Laceration Care, Adult A laceration is a cut that may go through all layers of the skin. The cut may also go into the tissue that is right under the skin. Some cuts heal on their own. Others need to be closed with stitches (sutures), staples, skin adhesive strips, or skin glue. Taking care of your injury lowers your risk of infection, helps your injury to heal better, and may prevent scarring. Supplies needed:  Soap.  Water.  Hand sanitizer.  Bandage (dressing).  Antibiotic ointment.  Clean towel. How to take care of your cut Wash your hands with soap and water before touching your wound or changing your bandage. If soap and water are not available, use hand sanitizer. If your doctor used stitches or staples:  Keep the wound clean and dry.  If you were given a bandage, change it at least once a day as told by your doctor. You should also change it if it gets wet or dirty.  Keep the wound completely dry for the first 24 hours, or as told by your doctor. After that, you may take a shower or a bath. Do not get the wound soaked in water until after the stitches or staples have been removed.  Clean the wound once a day, or as told by your doctor: ? Wash the wound with soap and water. ? Rinse the wound with water to remove all soap. ? Pat the wound dry with a clean towel. Do not rub the wound.  After you clean the wound, put a thin layer of antibiotic ointment on it as told by your doctor. This ointment: ? Helps to prevent infection. ? Keeps the bandage from sticking to the wound.  Have your stitches or staples removed as told by your doctor. If your doctor used skin adhesive strips:  Keep the wound clean and dry.  If you were given a bandage, you should change it at least once a day as told by your doctor. You should also change it if it gets wet or dirty.  Do not get the skin adhesive strips wet. You can take a shower or a bath, but keep the wound dry.  If the wound gets wet,  pat it dry with a clean towel. Do not rub the wound.  Skin adhesive strips fall off on their own. You can trim the strips as the wound heals. Do not remove any strips that are still stuck to the wound. They will fall off after a while. If your doctor used skin glue:  Try to keep your wound dry, but you may briefly wet it in the shower or bath. Do not soak the wound in water, such as by swimming.  After you take a shower or a bath, gently pat the wound dry with a clean towel. Do not rub the wound.  Do not do any activities that will make you really sweaty until the skin glue has fallen off on its own.  Do not apply liquid, cream, or ointment medicine to your wound while the skin glue is still on.  If you were given a bandage, you should change it at least once a day or as told by your doctor. You should also change it if it gets dirty or wet.  If a bandage is placed over the wound, do not let the tape touch the skin glue.  Do not pick at the glue. The skin glue usually stays on for 5-10 days. Then, it falls off the skin. General   instructions   Take over-the-counter and prescription medicines only as told by your doctor.  If you were given antibiotic medicine or ointment, take or apply it as told by your doctor. Do not stop using it even if your condition improves.  Do not scratch or pick at the wound.  Check your wound every day for signs of infection. Watch for: ? Redness, swelling, or pain. ? Fluid, blood, or pus.  Raise (elevate) the injured area above the level of your heart while you are sitting or lying down.  If directed, put ice on the affected area: ? Put ice in a plastic bag. ? Place a towel between your skin and the bag. ? Leave the ice on for 20 minutes, 2-3 times a day.  Prevent scarring by covering your wound with sunscreen of at least 30 SPF whenever you are outside after your wound has healed.  Keep all follow-up visits as told by your doctor. This is  important. Get help if:  You got a tetanus shot and you have any of these problems at the injection site: ? Swelling. ? Very bad pain. ? Redness. ? Bleeding.  You have a fever.  A wound that was closed breaks open.  You notice a bad smell coming from your wound or your bandage.  You notice something coming out of the wound, such as wood or glass.  Medicine does not relieve your pain.  You have more redness, swelling, or pain at the site of your wound.  You have fluid, blood, or pus coming from your wound.  You notice a change in the color of your skin near your wound.  You need to change the bandage often because fluid, blood, or pus is coming from the wound.  You start to have a new rash.  You start to have numbness around the wound. Get help right away if:  You have very bad swelling around the wound.  Your pain suddenly gets worse and is very bad.  You notice painful lumps near the wound or anywhere on your body.  You have a red streak going away from your wound.  The wound is on your hand or foot, and: ? You cannot move a finger or toe. ? Your fingers or toes look pale or bluish. Summary  A laceration is a cut that may go through all layers of the skin. The cut may also go into the tissue right under the skin.  Some cuts heal on their own. Others need to be closed with stitches, staples, skin adhesive strips, or skin glue.  Follow your doctor's instructions for caring for your cut. Proper care of a cut lowers the risk of infection, helps the cut heal better, and prevents scarring. This information is not intended to replace advice given to you by your health care provider. Make sure you discuss any questions you have with your health care provider. Document Revised: 07/11/2017 Document Reviewed: 06/02/2017 Elsevier Patient Education  2020 [employer]   Post-Concussion Syndrome  Post-concussion syndrome is when symptoms last longer than normal after a  head injury. What are the signs or symptoms? After a head injury, you may:  Have headaches.  Feel tired.  Feel dizzy.  Feel weak.  Have trouble seeing.  Have trouble in bright lights.  Have trouble hearing.  Not be able to remember things.  Not be able to focus.  Have trouble sleeping.  Have mood swings.  Have trouble learning new things. These can last from  weeks to months. Follow these instructions at home: Medicines  Take all medicines only as told by your doctor.  Do not take prescription pain medicines. Activity  Limit activities as told by your doctor. This includes: ? Homework. ? Job-related work. ? Thinking. ? Watching TV. ? Using a computer or phone. ? Puzzles. ? Exercise. ? Sports.  Slowly return to your normal activity as told by your doctor.  Stop an activity if you have symptoms.  Do not do anything that may cause you to get injured again. General instructions  Rest. Try to: ? Sleep 7-9 hours each night. ? Take naps or breaks when you feel tired during the day.  Do not drink alcohol until your doctor says that you can.  Keep track of your symptoms.  Keep all follow-up visits as told by your doctor. This is important. Contact a doctor if:  You do not improve.  You get worse.  You have another injury. Get help right away if:  You have a very bad headache.  You feel confused.  You feel very sleepy.  You pass out (faint).  You throw up (vomit).  You feel weak in any part of your body.  You feel numb in any part of your body.  You start shaking (have a seizure).  You have trouble talking. Summary  Post-concussion syndrome is when symptoms last longer than normal after a head injury.  Limit all activity after your injury. Gradually return to normal activity as told by your doctor.  Rest, do not drink alcohol, and avoid prescription pain medicines after a concussion.  Call your doctor if your symptoms get  worse. This information is not intended to replace advice given to you by your health care provider. Make sure you discuss any questions you have with your health care provider. Document Revised: 03/05/2018 Document Reviewed: 06/17/2017 Elsevier Patient Education  2020 [employer].

## 2019-11-17 NOTE — ED Notes (Signed)
Pharmacy has given pt his prescriptions, pt's mother has came to pick him up, D/C papers have been given.

## 2019-11-17 NOTE — ED Notes (Signed)
SDU Breakfast Ordered 

## 2019-11-19 ENCOUNTER — Emergency Department (HOSPITAL_COMMUNITY)
Admission: EM | Admit: 2019-11-19 | Discharge: 2019-11-19 | Disposition: A | Discharge status: Home / Self Care | Payer: Self-pay | Attending: Emergency Medicine | Admitting: Emergency Medicine

## 2019-11-19 ENCOUNTER — Encounter (HOSPITAL_COMMUNITY): Payer: Self-pay | Admitting: Emergency Medicine

## 2019-11-19 DIAGNOSIS — R220 Localized swelling, mass and lump, head: Secondary | ICD-10-CM | POA: Insufficient documentation

## 2019-11-19 DIAGNOSIS — Z5321 Procedure and treatment not carried out due to patient leaving prior to being seen by health care provider: Secondary | ICD-10-CM | POA: Insufficient documentation
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
## 2019-11-19 NOTE — ED Notes (Signed)
Patient stated they no longer wanted to wait to be seen 

## 2019-12-14 IMAGING — DX DG FEMUR 2+V*R*
5 series · 5 of 5 positions shown · non-contrast
Comparison: None.

CLINICAL DATA: Right femur pain

EXAM:
RIGHT FEMUR 2 VIEWS

[femur ap (1 of 2)]
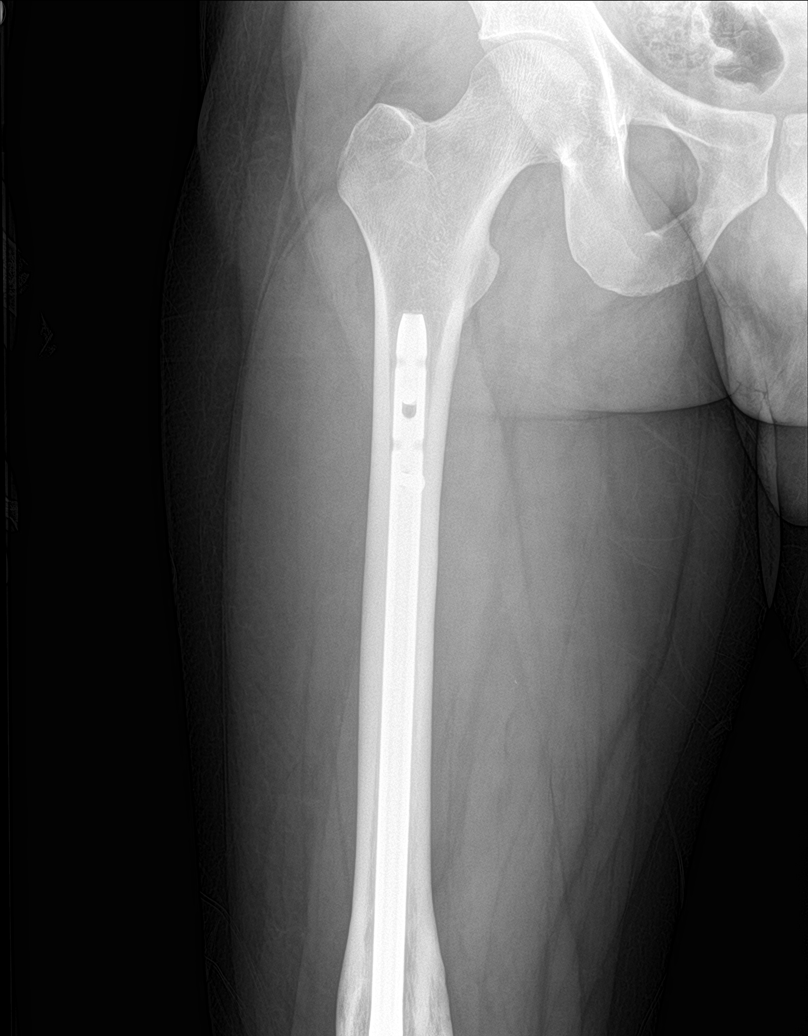

[femur ap (2 of 2)]
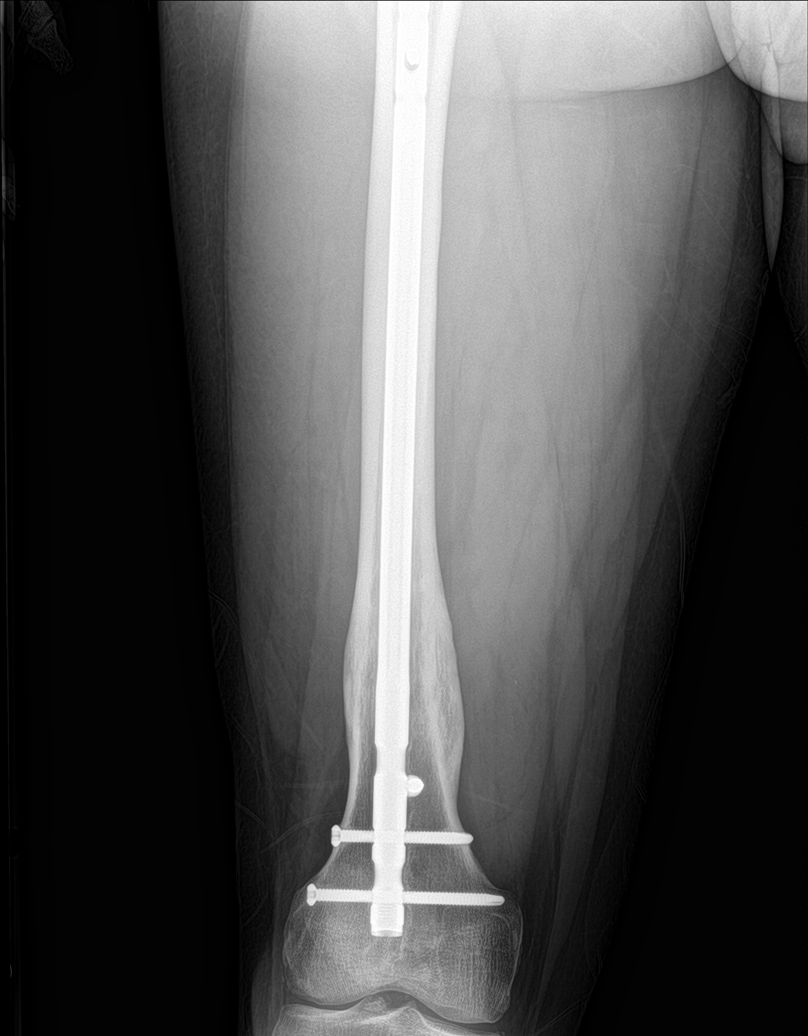

[femur lat (1 of 3)]
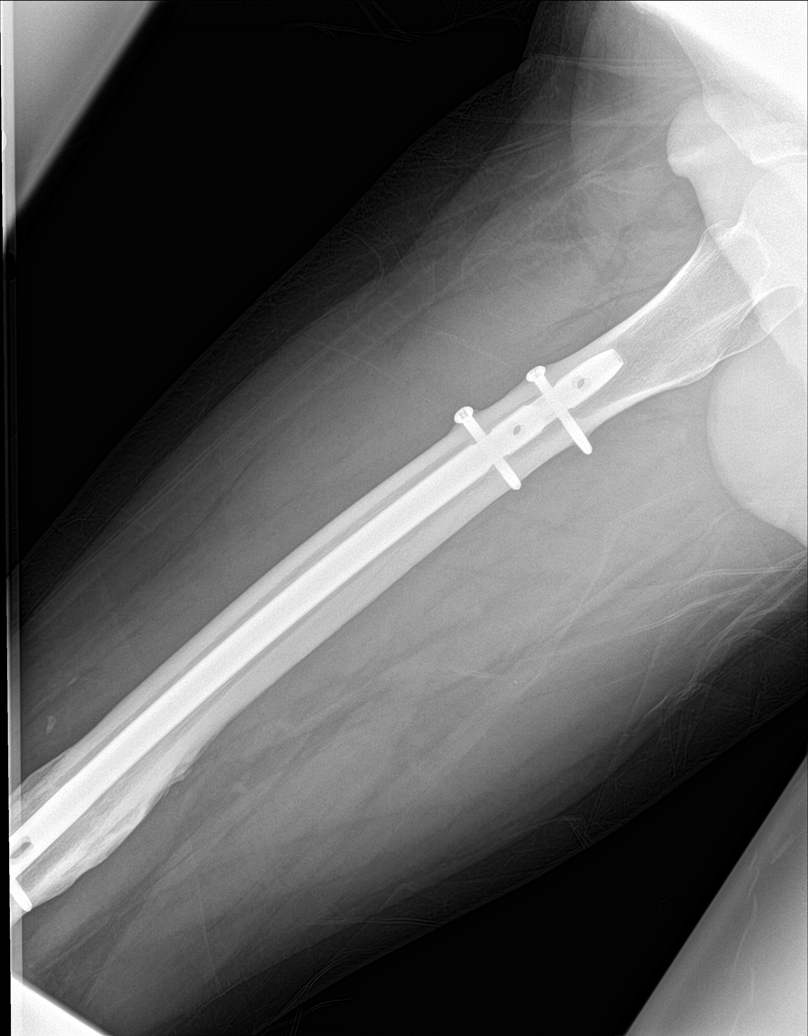

[femur lat (2 of 3)]
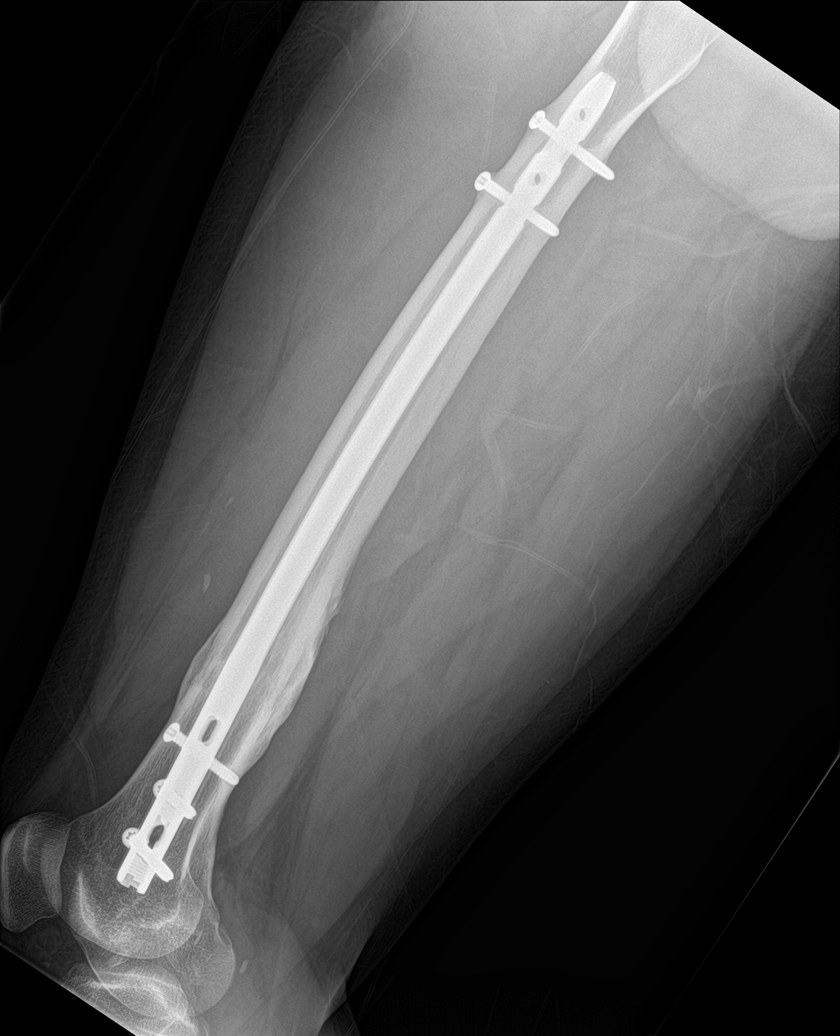

[femur lat (3 of 3)]
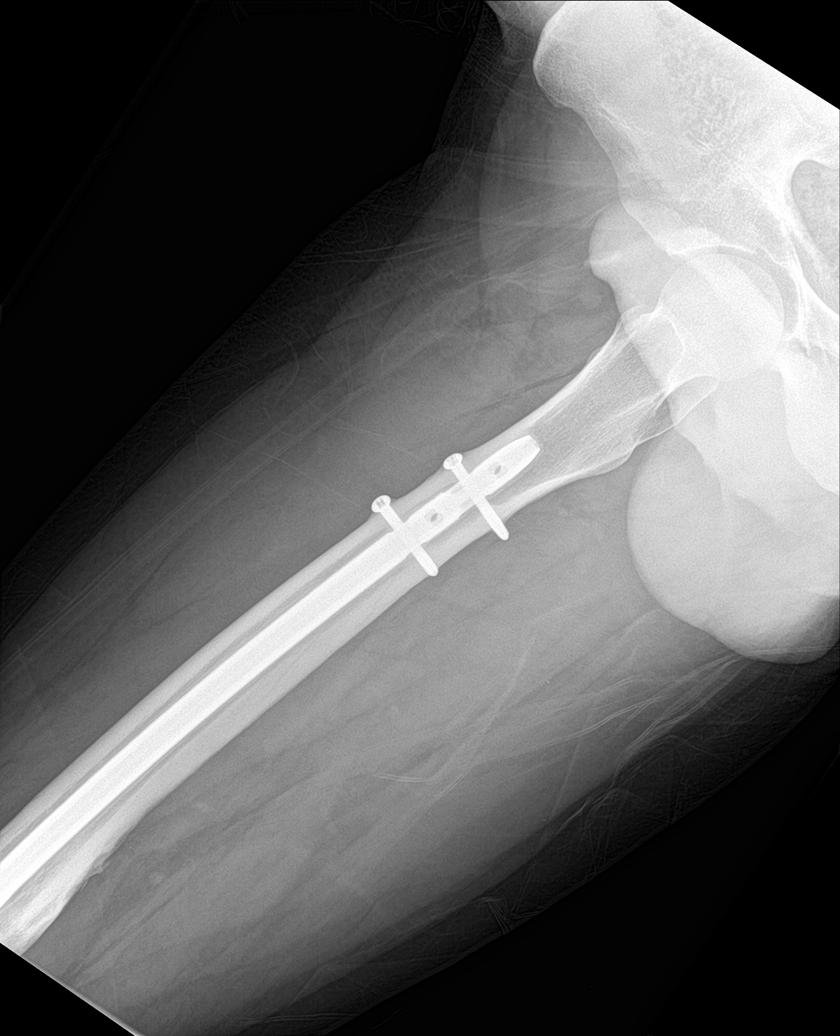

[5 of 5 positions shown; findings below may reference images not displayed]

FINDINGS: Chronic healed fracture distal femur. No acute fracture. Locking
intramedullary rod in satisfactory position. No evidence of hardware
loosening.
IMPRESSION: Chronic healed fracture distal femur.  No acute abnormality.

## 2019-12-14 IMAGING — DX DG KNEE COMPLETE 4+V*R*
4 series · 4 of 4 positions shown · non-contrast
Comparison: 02/17/2017

CLINICAL DATA: Pain right femur.  Hardware issue.

EXAM:
RIGHT KNEE - COMPLETE 4+ VIEW

[knee ap]
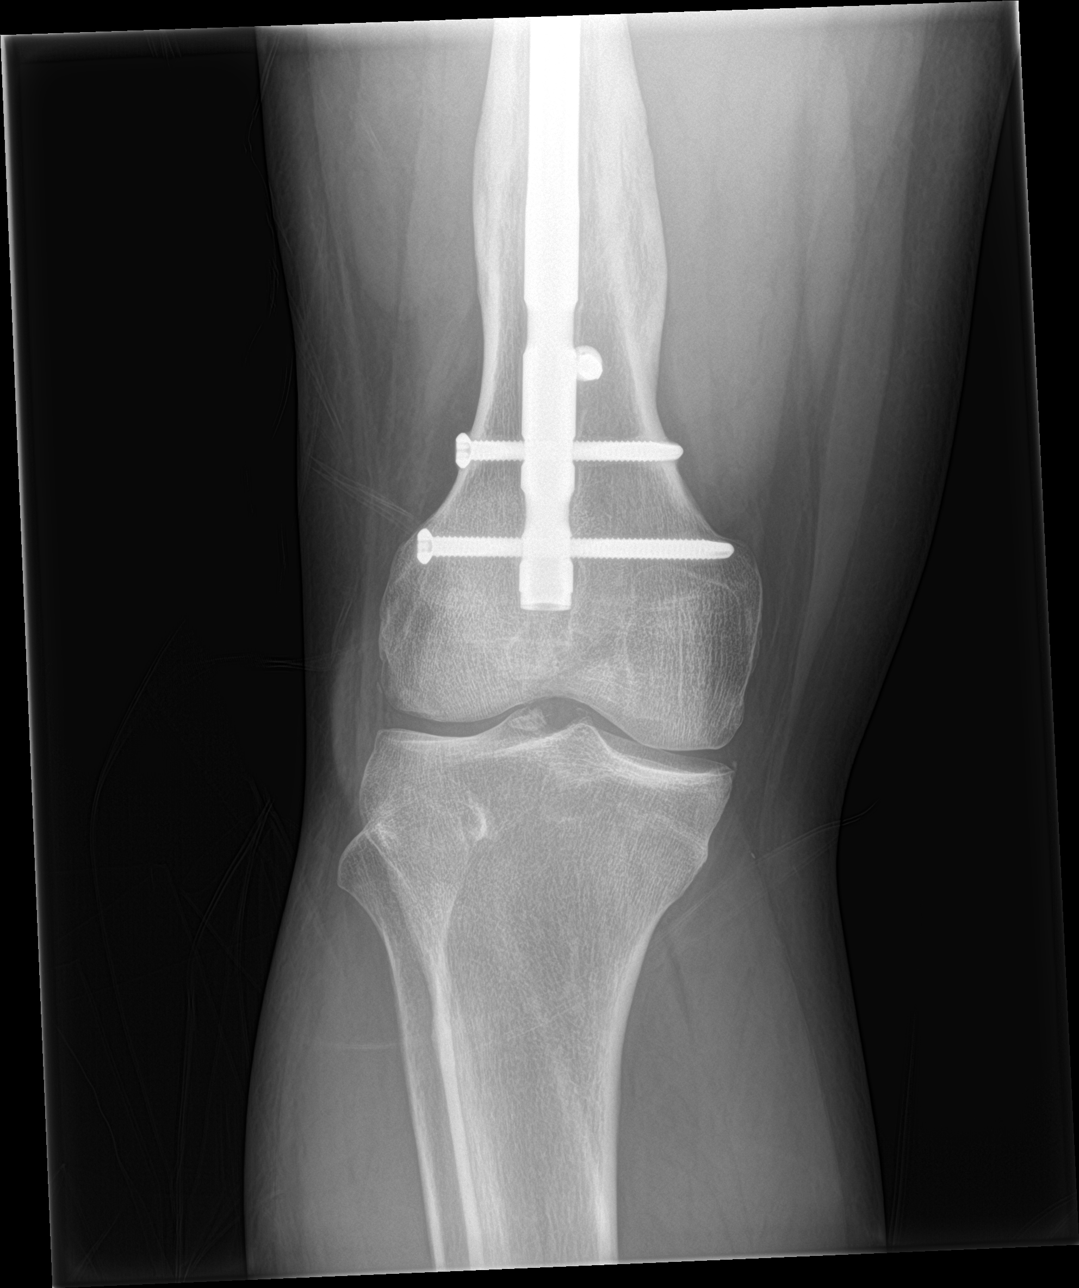

[knee lat]
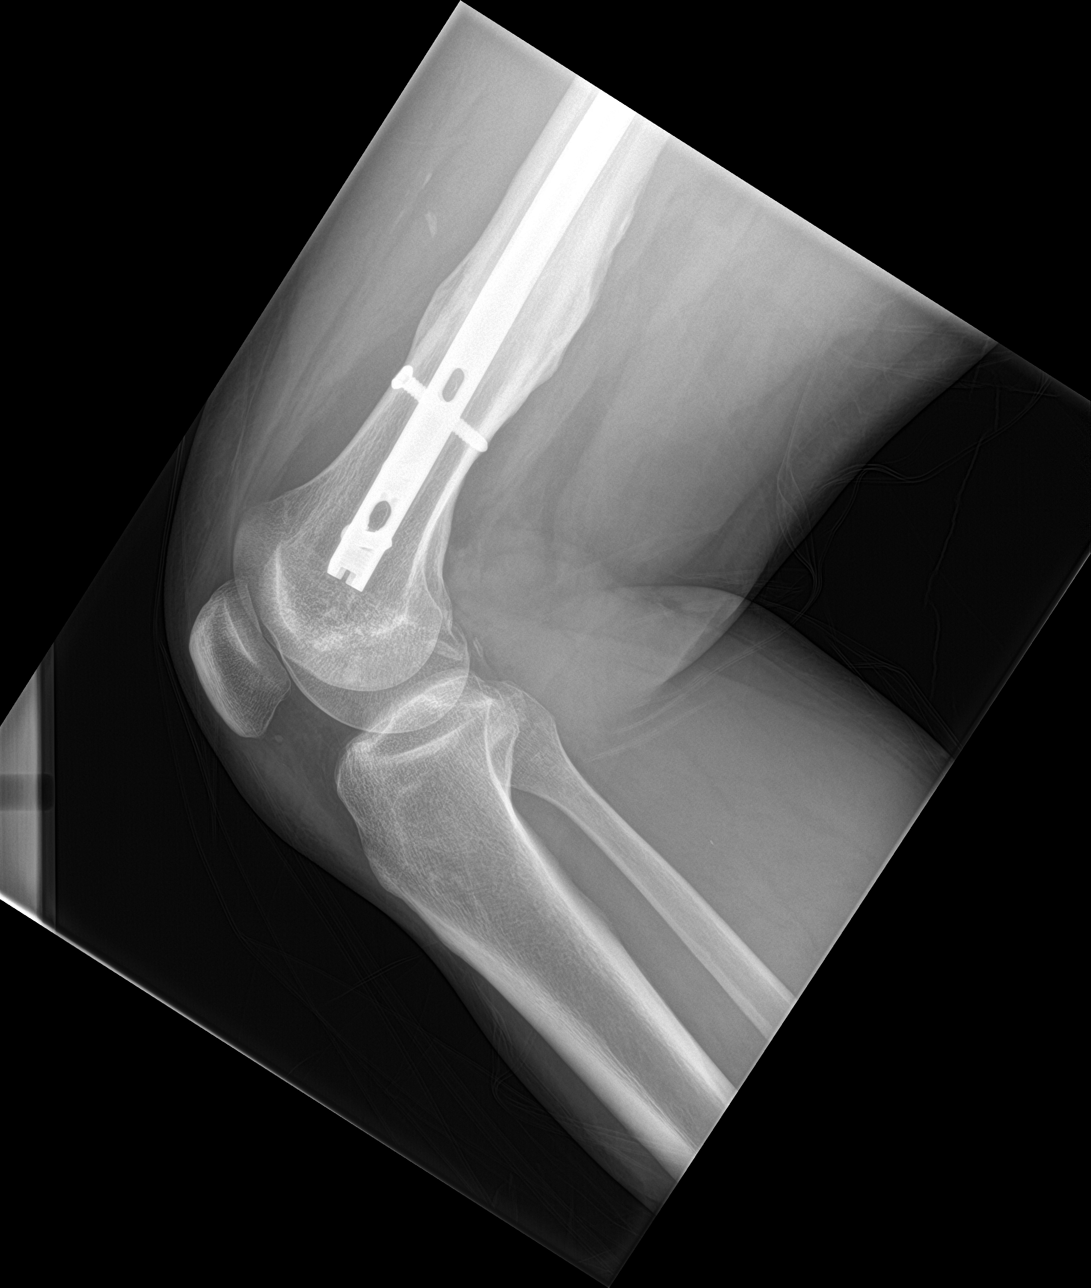

[knee obl (1 of 2)]
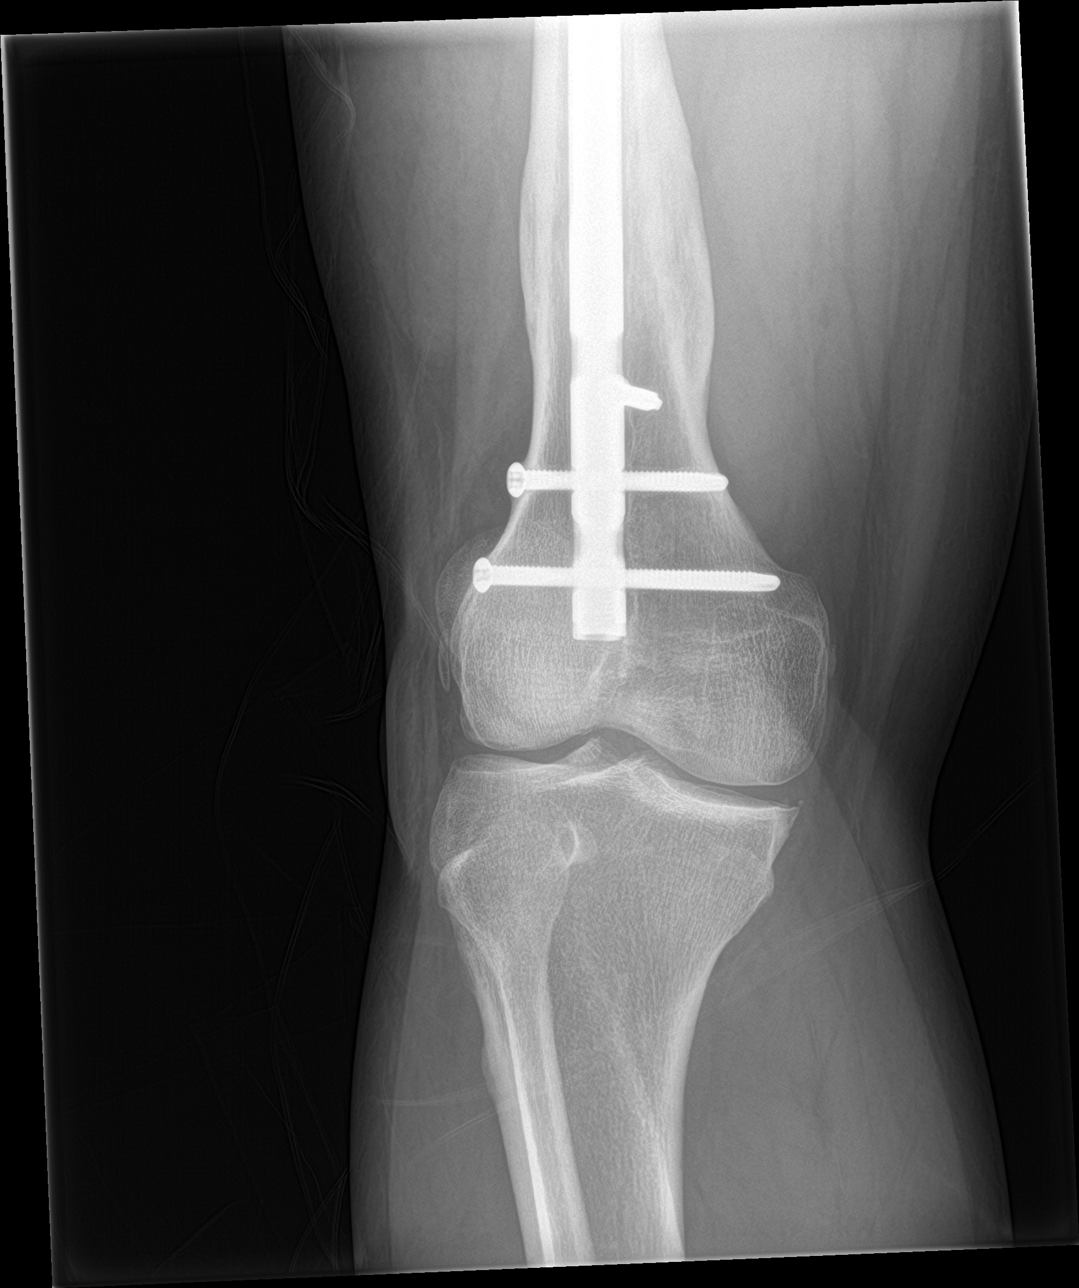

[knee obl (2 of 2)]
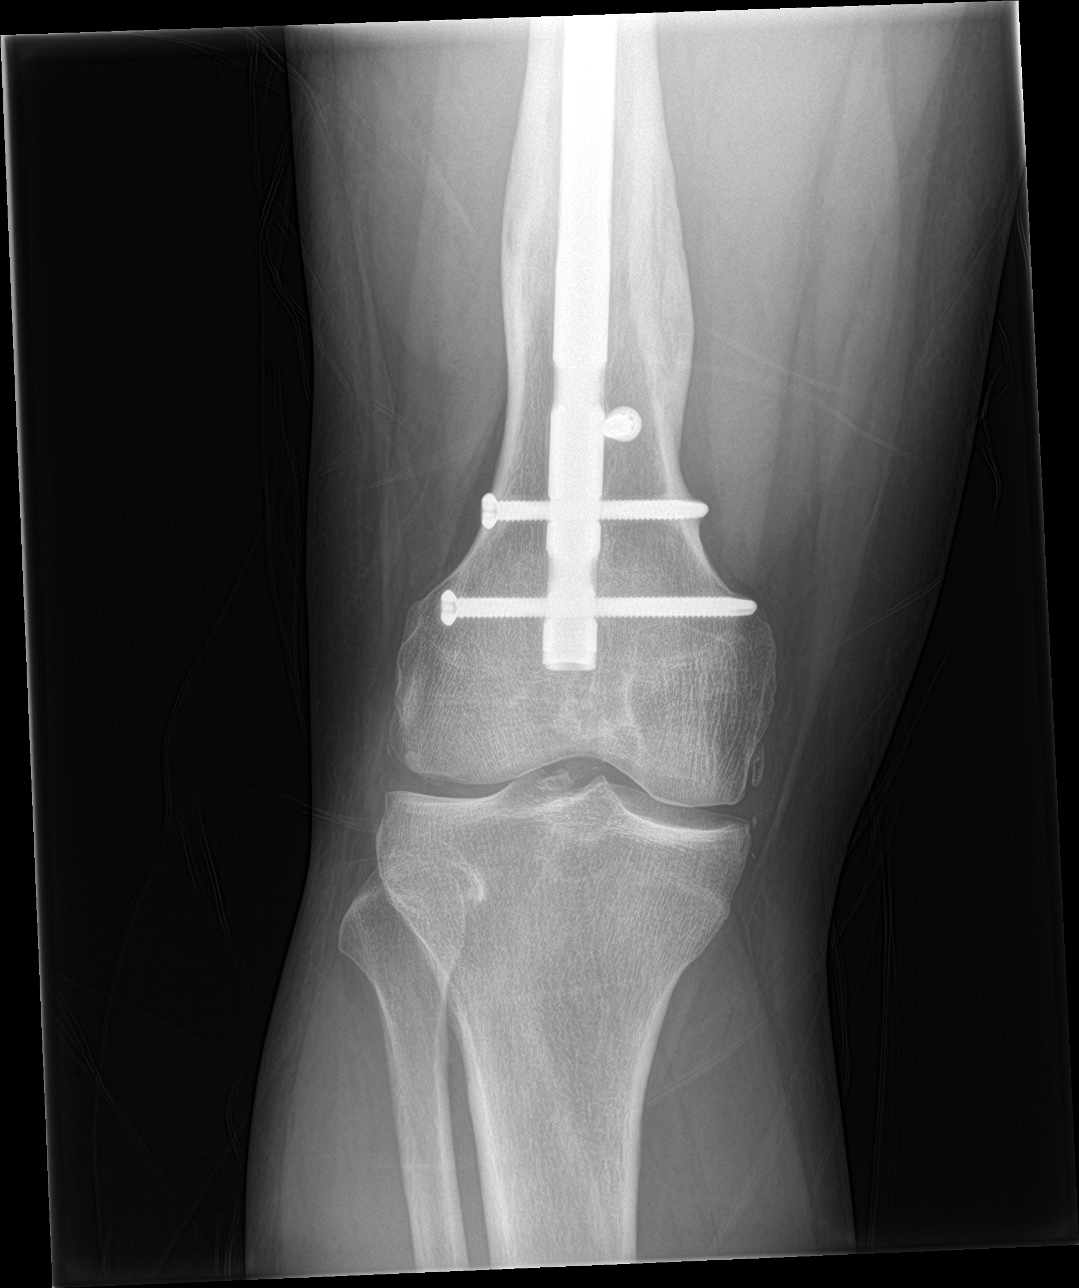

[4 of 4 positions shown; findings below may reference images not displayed]

FINDINGS: Chronic fracture distal femur has healed. Locking intramedullary rod
in good position.

No acute fracture

Calcification in the medial collateral ligament is unchanged due to
chronic injury. Joint spaces are preserved. Calcific loose bodies
overlying the joint space centrally unchanged from the prior study.
IMPRESSION: No acute abnormality and no change from prior study.

## 2021-12-01 LAB — GLUCOSE, POCT (MANUAL RESULT ENTRY): POC Glucose: 104 mg/dl — AB (ref 70–99)

## 2022-01-23 ENCOUNTER — Encounter: Payer: Self-pay | Admitting: Emergency Medicine

## 2022-01-23 ENCOUNTER — Ambulatory Visit
Admission: EM | Admit: 2022-01-23 | Discharge: 2022-01-23 | Disposition: A | Discharge status: Home / Self Care | Payer: Self-pay | Attending: Physician Assistant | Admitting: Physician Assistant

## 2022-01-23 DIAGNOSIS — Z1152 Encounter for screening for COVID-19: Secondary | ICD-10-CM | POA: Insufficient documentation

## 2022-01-23 DIAGNOSIS — Z20822 Contact with and (suspected) exposure to covid-19: Secondary | ICD-10-CM | POA: Insufficient documentation

## 2022-01-23 NOTE — ED Provider Notes (Signed)
[facility]    CSN: [id number] Arrival date & time: 01/23/22  1635      History   Chief Complaint Chief Complaint  Patient presents with   covid test    HPI Ronnie Rivera is a 36 y.o. male.   Patient presents today requesting COVID test.  Reports that he was exposed approximately a week ago.  He denies any current symptoms including cough, congestion, fever, nausea, vomiting.  He has not had COVID in the past.  He has not had a COVID-vaccine.  Denies any significant past medical history including allergies, asthma, COPD.  He does not smoke.  Denies history of diabetes, immunosuppression, malignancy, chronic liver/kidney disease.    History reviewed. No pertinent past medical history.  Patient Active Problem List   Diagnosis Date Noted   Scalp laceration 11/16/2019    History reviewed. No pertinent surgical history.     Home Medications    Prior to Admission medications   Medication Sig Start Date End Date Taking? Authorizing Provider  acetaminophen (TYLENOL) 500 MG tablet Take 2 tablets (1,000 mg total) by mouth every 6 (six) hours as needed for mild pain or headache. 11/17/19   Juliet Rude, PA-C  Ferrous Sulfate (IRON) 325 (65 Fe) MG TABS Take 1 tablet (325 mg total) by mouth daily. 11/17/19 12/17/19  Juliet Rude, PA-C  oxyCODONE (OXY IR/ROXICODONE) 5 MG immediate release tablet Take 1-2 tablets (5-10 mg total) by mouth every 6 (six) hours as needed for moderate pain. 11/17/19   Juliet Rude, PA-C    Family History History reviewed. No pertinent family history.  Social History Social History   Tobacco Use   Smoking status: Never   Smokeless tobacco: Never  Vaping Use   Vaping Use: Never used  Substance Use Topics   Alcohol use: Yes    Comment: occ   Drug use: Yes    Types: Marijuana    Comment: occ     Allergies   Patient has no known allergies.   Review of Systems Review of Systems  Constitutional:  Negative for  activity change, appetite change, fatigue and fever.  HENT:  Negative for congestion, sinus pressure, sneezing and sore throat.   Respiratory:  Negative for cough and shortness of breath.   Cardiovascular:  Negative for chest pain.  Gastrointestinal:  Negative for abdominal pain, diarrhea, nausea and vomiting.     Physical Exam Triage Vital Signs ED Triage Vitals  Enc Vitals Group     BP 01/23/22 1714 (!) 141/88     Pulse Rate 01/23/22 1714 60     Resp 01/23/22 1714 17     Temp 01/23/22 1714 98.4 F (36.9 C)     Temp src --      SpO2 01/23/22 1714 96 %     Weight --      Height --      Head Circumference --      Peak Flow --      Pain Score 01/23/22 1713 0     Pain Loc --      Pain Edu? --      Excl. in GC? --    No data found.  Updated Vital Signs BP (!) 141/88   Pulse 60   Temp 98.4 F (36.9 C)   Resp 17   SpO2 96%   Visual Acuity Right Eye Distance:   Left Eye Distance:   Bilateral Distance:    Right Eye Near:   Left  Eye Near:    Bilateral Near:     Physical Exam Vitals reviewed.  Constitutional:      General: He is awake.     Appearance: Normal appearance. He is well-developed. He is not ill-appearing.     Comments: Very pleasant male appears stated age in no acute distress sitting comfortably in exam room  HENT:     Head: Normocephalic and atraumatic.     Right Ear: Tympanic membrane, ear canal and external ear normal. Tympanic membrane is not erythematous or bulging.     Left Ear: Tympanic membrane, ear canal and external ear normal. Tympanic membrane is not erythematous or bulging.     Nose: Nose normal.     Mouth/Throat:     Pharynx: Uvula midline. No oropharyngeal exudate or posterior oropharyngeal erythema.  Cardiovascular:     Rate and Rhythm: Normal rate and regular rhythm.     Heart sounds: Normal heart sounds, S1 normal and S2 normal. No murmur heard. Pulmonary:     Effort: Pulmonary effort is normal. No accessory muscle usage or  respiratory distress.     Breath sounds: Normal breath sounds. No stridor. No wheezing, rhonchi or rales.     Comments: Clear to auscultate bilaterally Neurological:     Mental Status: He is alert.  Psychiatric:        Behavior: Behavior is cooperative.      UC Treatments / Results  Labs (all labs ordered are listed, but only abnormal results are displayed) Labs Reviewed  SARS CORONAVIRUS 2 (TAT 6-24 HRS)    EKG   Radiology No results found.  Procedures Procedures (including critical care time)  Medications Ordered in UC Medications - No data to display  Initial Impression / Assessment and Plan / UC Course  I have reviewed the triage vital signs and the nursing notes.  Pertinent labs & imaging results that were available during my care of the patient were reviewed by me and considered in my medical decision making (see chart for details).     Patient is well-appearing, afebrile, nontoxic, nontachycardic.  COVID testing was obtained-results pending.  Discussed that he can return to work once he receives his results and he was provided a work excuse note with current CDC return to work guidelines based on result.  Discussed that if he develops any additional symptoms he should be reevaluated.  Strict return precautions given.  Final Clinical Impressions(s) / UC Diagnoses   Final diagnoses:  Encounter for screening for COVID-19  Contact with and (suspected) exposure to covid-19     Discharge Instructions      Monitor your MyChart for your results.  If you are positive you need to be out of work for 5 days.  If you are negative you can return as soon as you receive the result.  If you develop any symptoms please return for reevaluation.     ED Prescriptions   None    PDMP not reviewed this encounter.   Jeani Hawking, PA-C 01/23/22 1737

## 2022-01-23 NOTE — ED Triage Notes (Signed)
Pt is present today with concerns for covid. Pt states that he was exposed one week ago

## 2022-01-23 NOTE — Discharge Instructions (Signed)
Monitor your MyChart for your results.  If you are positive you need to be out of work for 5 days.  If you are negative you can return as soon as you receive the result.  If you develop any symptoms please return for reevaluation.

## 2022-01-24 LAB — SARS CORONAVIRUS 2 (TAT 6-24 HRS): SARS Coronavirus 2: NEGATIVE

## 2022-02-07 ENCOUNTER — Ambulatory Visit
Admission: EM | Admit: 2022-02-07 | Discharge: 2022-02-07 | Disposition: A | Discharge status: Home / Self Care | Payer: Self-pay | Attending: Internal Medicine | Admitting: Internal Medicine

## 2022-02-07 ENCOUNTER — Encounter: Payer: Self-pay | Admitting: Emergency Medicine

## 2022-02-07 DIAGNOSIS — Z20822 Contact with and (suspected) exposure to covid-19: Secondary | ICD-10-CM | POA: Insufficient documentation

## 2022-02-07 DIAGNOSIS — R21 Rash and other nonspecific skin eruption: Secondary | ICD-10-CM | POA: Insufficient documentation

## 2022-02-07 LAB — SARS CORONAVIRUS 2 BY RT PCR: SARS Coronavirus 2 by RT PCR: NEGATIVE

## 2022-02-07 MED ORDER — PREDNISONE 20 MG PO TABS: 40.0000 mg | ORAL_TABLET | Freq: Every day | ORAL | 0 refills | Status: AC

## 2022-02-07 NOTE — Discharge Instructions (Signed)
You have been prescribed prednisone given suspicious of allergic reaction rash.  Please follow-up if symptoms persist or worsen.  Your COVID test is pending.

## 2022-02-07 NOTE — ED Provider Notes (Signed)
[facility]    CSN: [id number] Arrival date & time: 02/07/22  1546      History   Chief Complaint Chief Complaint  Patient presents with   Rash    HPI Ronnie Rivera is a 36 y.o. male.   Patient presents with itchy rash to bilateral forearms.  Patient reports that he noticed the rash was present for about 4 days.  Denies any obvious changes in environment including lotions, soaps, detergents, foods, etc.  Has taken Benadryl with minimal improvement.  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  Denies any feelings of throat closing or shortness of breath.  Patient also is questing a COVID test.  He denies any associated symptoms or any confirmed exposure.  States that a lot of people have been "sneezing at work".   Rash   History reviewed. No pertinent past medical history.  Patient Active Problem List   Diagnosis Date Noted   Scalp laceration 11/16/2019    History reviewed. No pertinent surgical history.     Home Medications    Prior to Admission medications   Medication Sig Start Date End Date Taking? Authorizing Provider  predniSONE (DELTASONE) 20 MG tablet Take 2 tablets (40 mg total) by mouth daily for 5 days. 02/07/22 02/12/22 Yes Brysan Mcevoy, Acie Fredrickson, FNP  acetaminophen (TYLENOL) 500 MG tablet Take 2 tablets (1,000 mg total) by mouth every 6 (six) hours as needed for mild pain or headache. 11/17/19   Juliet Rude, PA-C  Ferrous Sulfate (IRON) 325 (65 Fe) MG TABS Take 1 tablet (325 mg total) by mouth daily. 11/17/19 12/17/19  Juliet Rude, PA-C  oxyCODONE (OXY IR/ROXICODONE) 5 MG immediate release tablet Take 1-2 tablets (5-10 mg total) by mouth every 6 (six) hours as needed for moderate pain. 11/17/19   Juliet Rude, PA-C    Family History History reviewed. No pertinent family history.  Social History Social History   Tobacco Use   Smoking status: Never   Smokeless tobacco: Never  Vaping Use   Vaping  Use: Never used  Substance Use Topics   Alcohol use: Yes    Comment: occ   Drug use: Yes    Types: Marijuana    Comment: occ     Allergies   Patient has no known allergies.   Review of Systems Review of Systems Per HPI  Physical Exam Triage Vital Signs ED Triage Vitals  Enc Vitals Group     BP 02/07/22 1654 134/83     Pulse Rate 02/07/22 1654 60     Resp 02/07/22 1654 18     Temp 02/07/22 1654 98 F (36.7 C)     Temp src --      SpO2 02/07/22 1654 97 %     Weight --      Height --      Head Circumference --      Peak Flow --      Pain Score 02/07/22 1653 0     Pain Loc --      Pain Edu? --      Excl. in GC? --    No data found.  Updated Vital Signs BP 134/83   Pulse 60   Temp 98 F (36.7 C)   Resp 18   SpO2 97%   Visual Acuity Right Eye Distance:   Left Eye Distance:   Bilateral Distance:    Right Eye Near:   Left Eye Near:  Bilateral Near:     Physical Exam Constitutional:      General: He is not in acute distress.    Appearance: Normal appearance. He is not toxic-appearing or diaphoretic.  HENT:     Head: Normocephalic and atraumatic.  Eyes:     Extraocular Movements: Extraocular movements intact.     Conjunctiva/sclera: Conjunctivae normal.  Pulmonary:     Effort: Pulmonary effort is normal.  Skin:    Comments: Maculopapular rash scattered throughout antecubital spaces and forearms of bilateral arms.  Neurological:     General: No focal deficit present.     Mental Status: He is alert and oriented to person, place, and time. Mental status is at baseline.  Psychiatric:        Mood and Affect: Mood normal.        Behavior: Behavior normal.        Thought Content: Thought content normal.        Judgment: Judgment normal.      UC Treatments / Results  Labs (all labs ordered are listed, but only abnormal results are displayed) Labs Reviewed  SARS CORONAVIRUS 2 BY RT PCR    EKG   Radiology No results  found.  Procedures Procedures (including critical care time)  Medications Ordered in UC Medications - No data to display  Initial Impression / Assessment and Plan / UC Course  I have reviewed the triage vital signs and the nursing notes.  Pertinent labs & imaging results that were available during my care of the patient were reviewed by me and considered in my medical decision making (see chart for details).     Rash is consistent with allergic versus irritant contact dermatitis.  Will treat with prednisone given suspicion of poison ivy versus poison oak.  Patient to follow-up if symptoms persist or worsen.  COVID test pending per patient request.  No associated symptoms per patient.  Discussed return precautions.  Patient verbalized understanding and was agreeable with plan. Final Clinical Impressions(s) / UC Diagnoses   Final diagnoses:  Rash and nonspecific skin eruption  Encounter for laboratory testing for COVID-19 virus     Discharge Instructions      You have been prescribed prednisone given suspicious of allergic reaction rash.  Please follow-up if symptoms persist or worsen.  Your COVID test is pending.   ED Prescriptions     Medication Sig Dispense Auth. Provider   predniSONE (DELTASONE) 20 MG tablet Take 2 tablets (40 mg total) by mouth daily for 5 days. 10 tablet Gustavus Bryant, [place]      PDMP not reviewed this encounter.   Gustavus Bryant, [place] 02/07/22 [phone]

## 2022-02-07 NOTE — ED Triage Notes (Signed)
Pt is present today with a rash on both arms. Pt states he noticed the rash Monday

## 2022-02-19 ENCOUNTER — Ambulatory Visit
Admission: EM | Admit: 2022-02-19 | Discharge: 2022-02-19 | Disposition: A | Discharge status: Home / Self Care | Payer: Self-pay | Attending: Physician Assistant | Admitting: Physician Assistant

## 2022-02-19 DIAGNOSIS — Z1152 Encounter for screening for COVID-19: Secondary | ICD-10-CM | POA: Insufficient documentation

## 2022-02-19 NOTE — ED Provider Notes (Signed)
[facility]    CSN: [id number] Arrival date & time: 02/19/22  1445      History   Chief Complaint Chief Complaint  Patient presents with   Headache    HPI Ronnie Rivera is a 36 y.o. male.   Here today for evaluation of headache and dry cough.  He notes symptoms have been ongoing for the last 2 days.  He does work in a freezer at work and is not sure if this has exacerbated symptoms.  He does note increased stress recently as his mother passed and he has still been grieving over same.  He does not report fever that he is aware of.  The history is provided by the patient.  Headache Associated symptoms: cough   Associated symptoms: no congestion, no fever, no numbness and no sore throat     History reviewed. No pertinent past medical history.  Patient Active Problem List   Diagnosis Date Noted   Scalp laceration 11/16/2019    History reviewed. No pertinent surgical history.     Home Medications    Prior to Admission medications   Medication Sig Start Date End Date Taking? Authorizing Provider  acetaminophen (TYLENOL) 500 MG tablet Take 2 tablets (1,000 mg total) by mouth every 6 (six) hours as needed for mild pain or headache. 11/17/19   Norm Parcel, PA-C  Ferrous Sulfate (IRON) 325 (65 Fe) MG TABS Take 1 tablet (325 mg total) by mouth daily. 11/17/19 12/17/19  Norm Parcel, PA-C  oxyCODONE (OXY IR/ROXICODONE) 5 MG immediate release tablet Take 1-2 tablets (5-10 mg total) by mouth every 6 (six) hours as needed for moderate pain. 11/17/19   Norm Parcel, PA-C    Family History History reviewed. No pertinent family history.  Social History Social History   Tobacco Use   Smoking status: Never   Smokeless tobacco: Never  Vaping Use   Vaping Use: Never used  Substance Use Topics   Alcohol use: Yes    Comment: occ   Drug use: Yes    Types: Marijuana    Comment: occ     Allergies   Patient has no known allergies.   Review of  Systems Review of Systems  Constitutional:  Negative for chills and fever.  HENT:  Negative for congestion and sore throat.   Eyes:  Negative for discharge and redness.  Respiratory:  Positive for cough. Negative for shortness of breath.   Neurological:  Positive for headaches. Negative for numbness.     Physical Exam Triage Vital Signs ED Triage Vitals  Enc Vitals Group     BP 02/19/22 1547 128/88     Pulse Rate 02/19/22 1547 74     Resp --      Temp 02/19/22 1547 98 F (36.7 C)     Temp Source 02/19/22 1547 Oral     SpO2 02/19/22 1547 97 %     Weight --      Height --      Head Circumference --      Peak Flow --      Pain Score 02/19/22 1550 0     Pain Loc --      Pain Edu? --      Excl. in [place]? --    No data found.  Updated Vital Signs BP 128/88 (BP Location: Left Arm)   Pulse 74   Temp 98 F (36.7 C) (Oral)   SpO2 97%   Physical Exam Vitals and nursing  note reviewed.  Constitutional:      General: He is not in acute distress.    Appearance: Normal appearance. He is not ill-appearing.  HENT:     Head: Normocephalic and atraumatic.  Eyes:     Conjunctiva/sclera: Conjunctivae normal.  Cardiovascular:     Rate and Rhythm: Normal rate.  Pulmonary:     Effort: Pulmonary effort is normal.  Neurological:     Mental Status: He is alert.  Psychiatric:        Mood and Affect: Mood normal.        Behavior: Behavior normal.        Thought Content: Thought content normal.      UC Treatments / Results  Labs (all labs ordered are listed, but only abnormal results are displayed) Labs Reviewed  SARS CORONAVIRUS 2 (TAT 6-24 HRS)    EKG   Radiology No results found.  Procedures Procedures (including critical care time)  Medications Ordered in UC Medications - No data to display  Initial Impression / Assessment and Plan / UC Course  I have reviewed the triage vital signs and the nursing notes.  Pertinent labs & imaging results that were available  during my care of the patient were reviewed by me and considered in my medical decision making (see chart for details).    We will order COVID screening given headache and cough.  Recommended patient establish care with primary care for further discussion regarding what sounds to be situational depression.  Patient expresses understanding and is agreeable to same.  Encouraged follow-up with any further concerns.  Final Clinical Impressions(s) / UC Diagnoses   Final diagnoses:  Encounter for screening for COVID-19   Discharge Instructions   None    ED Prescriptions   None    PDMP not reviewed this encounter.   Tomi Bamberger, PA-C 02/19/22 1844

## 2022-02-19 NOTE — ED Triage Notes (Signed)
Pt c/o of headache and dry cough. Says he is grieving and is feel like I stressed related. Has not taken anything for the headache

## 2022-02-20 LAB — SARS CORONAVIRUS 2 (TAT 6-24 HRS): SARS Coronavirus 2: NEGATIVE

## 2022-03-04 ENCOUNTER — Ambulatory Visit: Payer: Self-pay | Admitting: Family

## 2022-03-05 ENCOUNTER — Ambulatory Visit: Payer: Self-pay | Admitting: Family

## 2022-03-18 ENCOUNTER — Ambulatory Visit: Payer: Self-pay | Admitting: Physician Assistant

## 2022-03-19 NOTE — Progress Notes (Signed)
Patient not seen, did not return to mobile unit after requesting to be seen

## 2022-04-09 ENCOUNTER — Ambulatory Visit (HOSPITAL_COMMUNITY)
Admission: EM | Admit: 2022-04-09 | Discharge: 2022-04-09 | Disposition: A | Discharge status: Home / Self Care | Payer: No Payment, Other | Attending: Psychiatry | Admitting: Psychiatry

## 2022-04-09 DIAGNOSIS — F4323 Adjustment disorder with mixed anxiety and depressed mood: Secondary | ICD-10-CM | POA: Insufficient documentation

## 2022-04-09 DIAGNOSIS — F1290 Cannabis use, unspecified, uncomplicated: Secondary | ICD-10-CM | POA: Insufficient documentation

## 2022-04-09 NOTE — ED Provider Notes (Signed)
[facility] Medical Screening Exam  Patient Name: Ronnie Rivera MRN: [id number] Date of Evaluation: 04/09/22 Chief Complaint:   Diagnosis:  Final diagnoses:  Adjustment disorder with mixed anxiety and depressed mood    History of Present illness: Ronnie Rivera is a 36 y.o. male Ronnie Rivera 36 y.o., male patient presented to [facility] as a walk in with complaints of, "do you guys give hotel vouchers"..  He initially presented with his peers support specialist Bennie Pierini, but she left after dropping him off.   [name], 36 y.o., male patient seen face to face by this provider, consulted with Dr. Lucianne Muss; and chart reviewed on 04/09/22.  Patient denies any previous psychiatric history. He denies any medical concerns. He does not take any medications at this time. He has no outpatient services in place.   On evaluation Ronnie Rivera reports he was referred by his peer support specialist due to his recent stressors.  Reports recent stressors at work.  States other employees are making accusations due to drug use, which he denies.  States his employer is not treating him fairly and all he wants is a quality.  He is still currently employed but states, "I go to work when when I want to and when I feel like it".  He is also concerned that he is facing homelessness.  He currently does not have a permanent place to live he has been staying with different family members and friends.  He is requesting a hotel voucher.  Explained services that are offered at [facility] including limitations and that hotel vouchers are not given here.  However discussed homeless shelter/halfway houses.  Provided a list of vacancies for [facility]. He endorses marijuana use but denies all other substance use.  During evaluation Ronnie Rivera is observed sitting in the assessment room asleep in no acute distress. He is easily awakened. He is casually dressed and makes good eye contact.   He is alert/oriented x4, cooperative, and attentive.  He has normal speech and behavior.  He appears calm but states that his mood is anxious.  He endorses increased depression related to work stressors and thinking about the loss of his mother roughly one year ago.  He denies any concerns with appetite or sleep.  He denies SI/HI/AVH.  He contracts for safety.  Objectively he does not appear to be responding to internal/external stimuli. Patient is able to converse coherently, goal directed thoughts, no distractibility, or pre-occupation. He also denies psychosis and paranoia.  Patient answered question appropriately.    At this time Ronnie Rivera is educated and verbalizes understanding of mental health resources and other crisis services in the community.  He is instructed to call 911 and present to the nearest emergency room should he experience any suicidal/homicidal ideation, auditory/visual/hallucinations, or detrimental worsening of his mental health condition.  He was a also advised by Clinical research associate that he could call the toll-free phone on back of  insurance card to assist with identifying in network counselors and agencies or number on back of Medicaid card to speak with care coordinator  Flowsheet Row ED from 02/19/2022 in [facility]  ED from 02/07/2022 in [facility]  ED from 01/23/2022 in [facility] at [facility]   C-SSRS RISK CATEGORY No Risk No Risk No Risk       Psychiatric Specialty Exam  Presentation  General Appearance:Appropriate  for Environment; Casual  Eye Contact:Good  Speech:Clear and Coherent; Normal Rate  Speech Volume:Normal  Handedness:Right   Mood and Affect  Mood: Anxious; Depressed  Affect: Congruent   Thought Process  Thought Processes: Coherent  Descriptions of Associations:Intact  Orientation:Full (Time, Place and Person)  Thought Content:Logical     Hallucinations:None  Ideas of Reference:None  Suicidal Thoughts:No  Homicidal Thoughts:No   Sensorium  Memory: Immediate Good; Recent Good; Remote Good  Judgment: Good  Insight: Good   Executive Functions  Concentration: Good  Attention Span: Good  Recall: Good  Fund of Knowledge: Good  Language: Good   Psychomotor Activity  Psychomotor Activity: Normal   Assets  Assets: Physical Health; Resilience; Social Support; Desire for Improvement; Communication Skills; Financial Resources/Insurance; Vocational/Educational   Sleep  Sleep: Good  Number of hours:  7   No data recorded  Physical Exam: Physical Exam Vitals and nursing note reviewed.  Constitutional:      General: He is not in acute distress.    Appearance: Normal appearance. He is well-developed.  HENT:     Head: Normocephalic and atraumatic.  Eyes:     General:        Right eye: No discharge.        Left eye: No discharge.     Conjunctiva/sclera: Conjunctivae normal.  Cardiovascular:     Rate and Rhythm: Normal rate.  Pulmonary:     Effort: Pulmonary effort is normal. No respiratory distress.  Musculoskeletal:        General: Normal range of motion.     Cervical back: Normal range of motion.  Skin:    Coloration: Skin is not jaundiced or pale.  Neurological:     Mental Status: He is alert and oriented to person, place, and time.  Psychiatric:        Attention and Perception: Attention and perception normal.        Mood and Affect: Mood is anxious and depressed.        Speech: Speech normal.        Behavior: Behavior normal. Behavior is cooperative.        Thought Content: Thought content normal.        Cognition and Memory: Cognition normal.        Judgment: Judgment normal.   Review of Systems  Constitutional: Negative.   HENT: Negative.    Eyes: Negative.   Respiratory: Negative.    Cardiovascular: Negative.   Musculoskeletal: Negative.   Skin: Negative.    Neurological: Negative.   Psychiatric/Behavioral:  Positive for depression. The patient is nervous/anxious.    Blood pressure (!) 142/95, pulse (!) 59, temperature 98 F (36.7 C), temperature source Oral, resp. rate 18, SpO2 100 %. There is no height or weight on file to calculate BMI.  Musculoskeletal: Strength & Muscle Tone: within normal limits Gait & Station: normal Patient leans: N/A   [facility] MSE Discharge Disposition for Follow up and Recommendations: Based on my evaluation the patient does not appear to have an emergency medical condition and can be discharged with resources and follow up care in outpatient services for Medication Management and Individual Therapy  Discharge patient  Provided outpatient psychiatric resources for medication management and therapy.  Provided list of vacancies for [facility], provided other community resources for homelessness.   Ardis Hughs, NP 04/09/2022, 8:46 AM

## 2022-04-09 NOTE — BH Assessment (Addendum)
Comprehensive Clinical Assessment (CCA) Screening, Triage and Referral Note  04/09/2022 Ronnie Rivera [id number]  Triage/Screening completed. Patient is Routine. Please room patient.   Chief Complaint: Situational Depression   Visit Diagnosis: Depressive Disorder, Single Episode, Mild    Ronnie Rivera is a 36 y/o male that presents to the [facility] with no history of mental health illnesses. He was referred by his Peer Support Specialist "Ronnie Rivera". His complaint is "I'm having a emotional break down". Onset of symptoms started 1 week ago. Yesterday, "04/08/2022, Marked one year of my mothers death from cancer". Other stressors include: "facing homelessness" and issues at work.  Patient with no history of suicidal ideations, attempts, and/or gestures. No history of self injurious behaviors. Current depressive symptoms that include isolating self from others, irritable, and loss of interest in usual pleasures. Denies HI and AVH's. He does report marijuana use. He uses 1x per week. Last use was several weeks ago. No therapist/psychiatrist. Currently living with Peer Support Specialist temporarily, stating "I need a voucher for a hotel".      He has a peer counselor Ronnie Rivera) that   Patient Reported Information How did you hear about [place]? Peer Support Specialist What Is the Reason for Your Visit/Call Today? Situational Depression and Stress How Long Has This Been Causing You Problems? For the past week  What Do You Feel Would Help You the Most Today? Assistance with Housing and Therapy to help with stressors  Have You Recently Had Any Thoughts About Hurting Yourself? No Are You Planning to Commit Suicide/Harm Yourself At This time? No  Have you Recently Had Thoughts About Hurting Someone Ronnie Rivera? No Are You Planning to Harm Someone at This Time? No Explanation: No  Have You Used Any Alcohol or Drugs in the Past 24 Hours? No How Long Ago Did You Use Drugs or Alcohol? No What  Did You Use and How Much? No  Do You Currently Have a Therapist/Psychiatrist? No Name of Therapist/Psychiatrist:  No  Have You Been Recently Discharged From Any Office Practice or Programs? No Explanation of Discharge From Practice/Program: No   Collateral Involvement: No  Does Patient Have a Court Appointed Legal Guardian? No  Patient Determined To Be At Risk for Harm To Self or Others Based on Review of Patient Reported Information or Presenting Complaint? No Does Patient Present under Involuntary Commitment? No  [place] of Residence: [place]  Patient Currently Receiving the Following Services: No  Determination of Need: No  Options For Referral: No  Discharge Disposition: Discharge per Ronnie Jacks, NP    Ronnie Rivera, Counselor

## 2022-04-09 NOTE — ED Notes (Signed)
Patient A&O x 4, ambulatory. Patient discharged in no acute distress. Patient denied SI/HI, A/VH upon discharge. Discharge instructions explained by provider. Patient escorted to lobby via staff for transport to destination. Safety maintained.

## 2022-04-09 NOTE — Discharge Instructions (Addendum)
Substance Abuse Treatment Programs ° °Intensive Outpatient Programs °[facility]     °[address]      °[place], [place]                   °[phone]      ° °[facility] °[address] °[place], [place] °[phone] ° °[facility]     °(Inpatient and outpatient)     °[address]           °[phone]   ° °[facility] °336-288-1484 (Suboxone and Methadone) ° °[address]      °[place], [place] [address]      °336-882-2125      ° °[address] °[place], [place] °852-3033 ° °[facility] (Outpatient/Inpatient, [facility])    °(insurance only) [phone]      °       °[facility] (Groups & Residential) °[place], [place] °336-389-1413 ° °   °[facility]     °[address]     °[place], [place]      °[phone]      ° °[facility] (for caregivers and family) °[address] °[place], [place] °336-299-4655 ° ° ° ° ° °Residential Treatment Programs °[facility]      °[address], [place], [place] [address]  °([phone]      ° °[facility] °[address] [place], [place] [address] °[phone] ° °[facility]        °336-248-8914      ° °[facility] °1-800-659-3381 ° °[facility]             °[address]                                         °[place], [place]                                                °877-615-2722 or 336-784-9470                              ° °[facility] [place] °[address] °[place], [address] °1.877.941.8954 ° °[facility]    °[address]      °[place], [place]     °[phone]      ° °[facility] °[address] °[place], [place] °336-285-9073 ° °[facility]   °[address]     °[place], [place] [address]     °336-899-1550      °Admissions: 8am-3pm M-F ° °[facility] °[address] °[place],  [place] °336-227-7417 ° °[facility]: Residential Program (90 Days)   °[place], [place]      °336-725-8389 or 800-758-6077    ° °[facility] °[place], [place] °(Walk in Hours over the weekend or by referral) ° °[facility] °[address], [place],  [address] °(336) [phone] ° °Crisis Mobile: [facility]:  1-877-626-1772 (for crisis response 24 hours a day) °[facility] Hotline:      1-800-256-2452 °Outpatient Psychiatry and Counseling ° °[facility]: Mobile Crisis   Management 24 hours:  1-877-626-1772 ° °[facility] sliding scale fee and walk in schedule: M-F 8am-12pm/1pm-3pm °[address]  °[place], [place] [address] °336-387-6161 ° °[facility] °[address] °[place], [place] [address] °336-703-9650 ° °[facility] ([facility] [place] [facility])- new patient walk-in appointments available Monday - Friday 8am -3pm.          °[address] °[place], [place] [address] °336-676-6840 or crisis line- 336-676-6905 ° °[facility] Intensive Outpatient Therapy Program °[address] °[place], [place] [address] °336-832-9804 ° °[facility]                  °Crisis Services      °336.641.4993      °[address]     °[place], [place] [address]                ° °[facility]   °[facility] °[phone] °[address] °[place], [place] [address] ° ° °[facility]          °[address]  °[place], [place] [address]       °(336) [phone] ° °[facility] °[address] °[place], [place] [address] °336-292-1510 ° °[facility]    °[address]    °[place], [place] [address]     °336-632-3505      ° °[facility]ish McKinney, [place]     °[address]     °[place] [address]     °336-282-1251     °  °[facility] °[address] °[place] [address] ° °[facility]     °[address]     °[place], [place]      °336-542-2076      ° °[facility] °Shamsher Ahluwalia, MD °[address] °[place], [place] [address] °336-420-9558 ° °[facility]     °[address]     °[place], [place] [address]     °336-274-1237      ° °[facility] °[address] °[place], [place] [address] °336-854-4450 °Resources for Temporary Residential Assistance/Crisis Centers ° °DAY CENTERS °[facility] °M-F 8am-3pm   °[address] [place], [place] [address]   336-332-0824 °Services include: laundry, barbering, support groups, case management, phone  & computer access, showers, AA/NA mtgs, mental health/substance abuse nurse, job skills class, disability information, VA assistance, spiritual classes, etc.  ° °HOMELESS SHELTERS ° °[facility]     °[facility]   °[address], [place]     °336.271.5959       °       °[facility] (women and children)       °[address] [place] [address] °[place], [place] [address] °336-275-0820 °[email or web address] for application and process °Application Required ° °[facility]   °[address]    °[place] [address]     °336.886.4922       °             °[facility] °[address] °,  [address] °336.273.5572 °336-235-0363(schedule application appt.) °Application Required ° °[facility] (women only)    °[address]     °[place],  [address]     °336-884-1039      °  Intake starts 6pm daily °Need valid ID, SSC, & Police report °[facility] [place] °[address] °[place], [place] °336-881-5420 °Application Required ° °[facility] (men only)     °[address]      °[place], [place]     °[phone]      ° °[facility] °(Pregnant women only) °[address] °[place], [place] °336-275-0206 ° °[facility]      °[address]      °[place], [place] [address]     °336-722-9951      °       °[facility] °[address] °[place], [place] °336-723-1848 °90 day commitment/SA/Application process ° °[facility](men only)     °[address]     °[place], [place]     °336-748-1962       °Check-in at 7pm     °       °[facility] °[address] °[place], [place] [address] °336-248-6684 °Men/Women/Women and Children must be there by 7 pm ° °[facility] °[place],  °336-722-8721                ° °

## 2022-04-10 ENCOUNTER — Encounter: Payer: Self-pay | Admitting: *Deleted

## 2022-04-12 ENCOUNTER — Ambulatory Visit
Admission: EM | Admit: 2022-04-12 | Discharge: 2022-04-12 | Disposition: A | Discharge status: Home / Self Care | Payer: Self-pay | Attending: Internal Medicine | Admitting: Internal Medicine

## 2022-04-12 DIAGNOSIS — F411 Generalized anxiety disorder: Secondary | ICD-10-CM

## 2022-04-12 DIAGNOSIS — R0789 Other chest pain: Secondary | ICD-10-CM

## 2022-04-12 MED ORDER — HYDROXYZINE HCL 25 MG PO TABS: 25.0000 mg | ORAL_TABLET | Freq: Four times a day (QID) | ORAL | 0 refills | Status: DC | PRN

## 2022-04-12 NOTE — Discharge Instructions (Signed)
I have prescribed you hydroxyzine to take as needed for anxiety attacks.  Please be aware that this can cause drowsiness.  Do not take any illicit drugs or use marijuana while using this medication as it is a safety risk.  Primary care doctor appointment has been made for you for follow-up for this medication and your anxiety.  Recommend that you go straight to the emergency department if you start having suicidal ideation or thoughts to harm yourself.  Also recommend that you see a cardiologist which is a heart doctor with the provided contact information as well.  Go straight to the ER if chest pain worsens or if you develop any other symptoms.

## 2022-04-12 NOTE — ED Triage Notes (Addendum)
Pt presents to uc with co of cp and anxiety. Pt reports increase in stress at work and grief with losing his mother. Pt was seen at [facility] on Monday. Pt reports he spoke with a provider but that is all they did. Pt expresses concern as he feels like his anxiety is out of control. He feels like he is shut down. Difficulty functioning in everyday life. Pt reports this has been going on since last November but seems to be getting worse.   Pt reports right sided cp that is tight that he noticed more often when he is anxious, pt also reports it can worsen after eating. Pt reports he has never had anything like this. Pt is working 3rd shift and says it has been stressfull on his body.

## 2022-04-12 NOTE — Progress Notes (Signed)
Unable to contact pt after 3 attempts - chart review demonstrates multiple UC and ED visits since 12/01/21 event,  including to [facility] ED. Letter sent to pt with f/u options for PCP - 60 day follow up also indicated for health equity team.

## 2022-04-12 NOTE — ED Notes (Signed)
Set pcp appt up for Tuesday at 8 am and instructed patient on address and phone number.

## 2022-04-12 NOTE — ED Provider Notes (Signed)
[facility]    CSN: [id number] Arrival date & time: 04/12/22  1717      History   Chief Complaint Chief Complaint  Patient presents with   Anxiety   Chest Pain    HPI Ronnie Rivera is a 36 y.o. male.   Patient presents with concerns for anxiety as well as having chest pain.  Patient reports that he has been having feelings of anxiety and right-sided chest pain over the past few days.  He went to behavioral health urgent care on 04/09/2022 but states that they "just talked to him" and did not prescribe him any medications.  Patient states that the chest pain is mainly located on the right side but sometimes radiates over to the left.  He describes it as a burning sensation that lasts for about 5 minutes at a time.  He denies any current chest pain.  He states that it mainly occurs when he has been feeling anxious.  He does have some mild shortness of breath when this happens but denies headache, blurred vision, nausea, vomiting, dizziness.  Patient denies any pertinent medical history.  He does not take any daily prescription medications.  Patient states that he has been having a lot of increased stressors at his work as he was recently accused of selling marijuana at the workplace.  He states that these are false allegations.  He was asked to leave his workplace and has not been back since as there is an internal investigation going on.  He states that he does not want to go back to this workplace due to this.  Patient also reports that he recently lost his mother to cancer.  Patient states that he had dealt with similar feelings of anxiety when he was 36 years old after his father was murdered.  Has not had any anxiety symptoms since then.  He also states that he does not like to ride in a car and he feels anxious when this happens given that his grandmother was killed in a car accident while he was in the car.  He denies any suicidal ideation or intent to harm himself. He  states that he occasionally uses marijuana and "edibles" but does not do any other type of illicit drug use.    Anxiety  Chest Pain Associated symptoms: anxiety     History reviewed. No pertinent past medical history.  Patient Active Problem List   Diagnosis Date Noted   Scalp laceration 11/16/2019    History reviewed. No pertinent surgical history.     Home Medications    Prior to Admission medications   Medication Sig Start Date End Date Taking? Authorizing Provider  hydrOXYzine (ATARAX) 25 MG tablet Take 1 tablet (25 mg total) by mouth every 6 (six) hours as needed for anxiety. 04/12/22  Yes Rosalea Withrow, Hildred Alamin E, FNP  acetaminophen (TYLENOL) 500 MG tablet Take 2 tablets (1,000 mg total) by mouth every 6 (six) hours as needed for mild pain or headache. Patient not taking: Reported on 04/12/2022 11/17/19   Norm Parcel, PA-C  Ferrous Sulfate (IRON) 325 (65 Fe) MG TABS Take 1 tablet (325 mg total) by mouth daily. Patient not taking: Reported on 04/12/2022 11/17/19 12/17/19  Norm Parcel, PA-C  oxyCODONE (OXY IR/ROXICODONE) 5 MG immediate release tablet Take 1-2 tablets (5-10 mg total) by mouth every 6 (six) hours as needed for moderate pain. Patient not taking: Reported on 04/12/2022 11/17/19   Norm Parcel, PA-C    Family  History History reviewed. No pertinent family history.  Social History Social History   Tobacco Use   Smoking status: Never   Smokeless tobacco: Never  Vaping Use   Vaping Use: Never used  Substance Use Topics   Alcohol use: Yes    Alcohol/week: 1.0 standard drink of alcohol    Types: 1 Glasses of wine per week    Comment: occ   Drug use: Yes    Types: Marijuana    Comment: uses edibles occasionally.     Allergies   Patient has no known allergies.   Review of Systems Review of Systems Per HPI  Physical Exam Triage Vital Signs ED Triage Vitals  Enc Vitals Group     BP 04/12/22 1748 129/78     Pulse Rate 04/12/22 1748 71      Resp 04/12/22 1748 19     Temp 04/12/22 1748 97.6 F (36.4 C)     Temp Source 04/12/22 1748 Oral     SpO2 04/12/22 1748 98 %     Weight --      Height --      Head Circumference --      Peak Flow --      Pain Score 04/12/22 1746 0     Pain Loc --      Pain Edu? --      Excl. in GC? --    No data found.  Updated Vital Signs BP 129/78   Pulse 71   Temp 97.6 F (36.4 C) (Oral)   Resp 19   SpO2 98%   Visual Acuity Right Eye Distance:   Left Eye Distance:   Bilateral Distance:    Right Eye Near:   Left Eye Near:    Bilateral Near:     Physical Exam Constitutional:      General: He is not in acute distress.    Appearance: Normal appearance. He is not toxic-appearing or diaphoretic.  HENT:     Head: Normocephalic and atraumatic.  Eyes:     Extraocular Movements: Extraocular movements intact.     Conjunctiva/sclera: Conjunctivae normal.  Cardiovascular:     Rate and Rhythm: Normal rate and regular rhythm.     Pulses: Normal pulses.     Heart sounds: Normal heart sounds.  Pulmonary:     Effort: Pulmonary effort is normal. No respiratory distress.     Breath sounds: Normal breath sounds.  Neurological:     General: No focal deficit present.     Mental Status: He is alert and oriented to person, place, and time. Mental status is at baseline.  Psychiatric:        Mood and Affect: Mood normal.        Behavior: Behavior normal.        Thought Content: Thought content normal.        Judgment: Judgment normal.      UC Treatments / Results  Labs (all labs ordered are listed, but only abnormal results are displayed) Labs Reviewed - No data to display  EKG   Radiology No results found.  Procedures Procedures (including critical care time)  Medications Ordered in UC Medications - No data to display  Initial Impression / Assessment and Plan / UC Course  I have reviewed the triage vital signs and the nursing notes.  Pertinent labs & imaging results that were  available during my care of the patient were reviewed by me and considered in my medical decision making (see chart for details).  EKG completed showing sinus rhythm.  There is some T wave inversion in leads III and aVF.  No previous EKGs ror comparison.  Despite these changes, I do not have any concern for any acute cardiac ischemia/etiology especially given that symptoms occur when patient is "feeling anxious" and quickly resolve.  Symptoms do not occur with exertion either.  Patient also does not have any current chest pain.  Vital signs are stable and patient does not appear to be in any acute distress.  Given all of these factors, do not think that any emergent evaluation is necessary. HEAR score is 0 which poses patient a low risk for cardiac etiology.  Although, patient was given contact information for cardiology for follow-up for EKG findings and advised to follow-up on Monday.  PCP appointment made for patient as well prior to discharge.  Given that the symptoms seem to be anxiety related, will prescribe hydroxyzine to take as needed for anxiety.  Advised patient to avoid illicit drug use or marijuana use while taking hydroxyzine given safety risks.  Patient voiced understanding.  Limited options here in urgent care for treatment of anxiety so patient was advised to follow-up with PCP for this.  He denies suicidal ideation but was encouraged to go straight to the ER if he develops any of these thoughts or intent to harm himself.  Patient was also advised to go to the ER if chest pain worsens.  Patient verbalized understanding and was agreeable with plan. Final Clinical Impressions(s) / UC Diagnoses   Final diagnoses:  Anxiety state  Other chest pain     Discharge Instructions      I have prescribed you hydroxyzine to take as needed for anxiety attacks.  Please be aware that this can cause drowsiness.  Do not take any illicit drugs or use marijuana while using this medication as it is a  safety risk.  Primary care doctor appointment has been made for you for follow-up for this medication and your anxiety.  Recommend that you go straight to the emergency department if you start having suicidal ideation or thoughts to harm yourself.  Also recommend that you see a cardiologist which is a heart doctor with the provided contact information as well.  Go straight to the ER if chest pain worsens or if you develop any other symptoms.    ED Prescriptions     Medication Sig Dispense Auth. Provider   hydrOXYzine (ATARAX) 25 MG tablet Take 1 tablet (25 mg total) by mouth every 6 (six) hours as needed for anxiety. 12 tablet [place], Michele Rockers, [place]      PDMP not reviewed this encounter.   Teodora Medici, [place] 04/12/22 2015

## 2022-04-15 ENCOUNTER — Telehealth (HOSPITAL_COMMUNITY): Payer: Self-pay | Admitting: Professional

## 2022-04-15 ENCOUNTER — Ambulatory Visit (INDEPENDENT_AMBULATORY_CARE_PROVIDER_SITE_OTHER): Payer: No Payment, Other | Admitting: Licensed Clinical Social Worker

## 2022-04-15 ENCOUNTER — Ambulatory Visit (HOSPITAL_COMMUNITY): Payer: 59 | Admitting: Licensed Clinical Social Worker

## 2022-04-15 DIAGNOSIS — F4310 Post-traumatic stress disorder, unspecified: Secondary | ICD-10-CM | POA: Insufficient documentation

## 2022-04-15 DIAGNOSIS — F32A Depression, unspecified: Secondary | ICD-10-CM

## 2022-04-15 DIAGNOSIS — F411 Generalized anxiety disorder: Secondary | ICD-10-CM | POA: Diagnosis not present

## 2022-04-15 DIAGNOSIS — F439 Reaction to severe stress, unspecified: Secondary | ICD-10-CM | POA: Insufficient documentation

## 2022-04-15 HISTORY — DX: Post-traumatic stress disorder, unspecified: F43.10

## 2022-04-15 HISTORY — DX: Depression, unspecified: F32.A

## 2022-04-15 HISTORY — DX: Generalized anxiety disorder: F41.1
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
## 2022-04-17 ENCOUNTER — Encounter (HOSPITAL_COMMUNITY): Payer: Self-pay

## 2022-04-23 ENCOUNTER — Ambulatory Visit: Payer: Self-pay | Admitting: Family

## 2022-04-25 ENCOUNTER — Encounter: Payer: Self-pay | Admitting: Family

## 2022-04-25 NOTE — Progress Notes (Deleted)
   New Patient Office Visit  Subjective:  Patient ID: Ronnie Rivera, male    DOB: 09-Jan-1986  Age: 36 y.o. MRN: [id number]  CC: No chief complaint on file.   HPI Ronnie Rivera presents for establishing care today.  Assessment & Plan:   Problem List Items Addressed This Visit       Other   Mild depression - Primary   GAD (generalized anxiety disorder)    Subjective:    Outpatient Medications Prior to Visit  Medication Sig Dispense Refill  . acetaminophen (TYLENOL) 500 MG tablet Take 2 tablets (1,000 mg total) by mouth every 6 (six) hours as needed for mild pain or headache. (Patient not taking: Reported on 04/12/2022)    . Ferrous Sulfate (IRON) 325 (65 Fe) MG TABS Take 1 tablet (325 mg total) by mouth daily. (Patient not taking: Reported on 04/12/2022)    . hydrOXYzine (ATARAX) 25 MG tablet Take 1 tablet (25 mg total) by mouth every 6 (six) hours as needed for anxiety. 12 tablet 0  . oxyCODONE (OXY IR/ROXICODONE) 5 MG immediate release tablet Take 1-2 tablets (5-10 mg total) by mouth every 6 (six) hours as needed for moderate pain. (Patient not taking: Reported on 04/12/2022) 20 tablet 0   No facility-administered medications prior to visit.   Past Medical History:  Diagnosis Date  . Scalp laceration 11/16/2019   No past surgical history on file.  Objective:   Today's Vitals: There were no vitals taken for this visit.  Physical Exam Vitals and nursing note reviewed.  Constitutional:      General: He is not in acute distress.    Appearance: Normal appearance.  HENT:     Head: Normocephalic.  Cardiovascular:     Rate and Rhythm: Normal rate and regular rhythm.  Pulmonary:     Effort: Pulmonary effort is normal.     Breath sounds: Normal breath sounds.  Musculoskeletal:        General: Normal range of motion.     Cervical back: Normal range of motion.  Skin:    General: Skin is warm and dry.  Neurological:     Mental Status: He is alert and oriented to  person, place, and time.  Psychiatric:        Mood and Affect: Mood normal.   No orders of the defined types were placed in this encounter.   Dulce Sellar, NP

## 2022-04-26 ENCOUNTER — Ambulatory Visit: Payer: Self-pay | Admitting: Family

## 2022-04-26 DIAGNOSIS — F32A Depression, unspecified: Secondary | ICD-10-CM

## 2022-04-26 DIAGNOSIS — F411 Generalized anxiety disorder: Secondary | ICD-10-CM

## 2022-05-01 ENCOUNTER — Ambulatory Visit (HOSPITAL_COMMUNITY)
Admission: EM | Admit: 2022-05-01 | Discharge: 2022-05-01 | Disposition: A | Discharge status: Home / Self Care | Payer: No Payment, Other | Attending: Psychiatry | Admitting: Psychiatry

## 2022-05-01 DIAGNOSIS — Z59819 Housing instability, housed unspecified: Secondary | ICD-10-CM | POA: Insufficient documentation

## 2022-05-01 DIAGNOSIS — Z789 Other specified health status: Secondary | ICD-10-CM

## 2022-05-01 DIAGNOSIS — F32A Depression, unspecified: Secondary | ICD-10-CM | POA: Insufficient documentation

## 2022-05-01 DIAGNOSIS — F419 Anxiety disorder, unspecified: Secondary | ICD-10-CM | POA: Insufficient documentation

## 2022-05-01 NOTE — ED Provider Notes (Signed)
Behavioral Health Urgent Care Medical Screening Exam  Patient Name: Ronnie Rivera MRN: [id number] Date of Evaluation: 05/01/22 Chief Complaint: "when things bother my spirit and it feels like I'm told told to do something, it puts me in a state of mind where it feels like my back is against the wall, I go into survival mode". Diagnosis:  Final diagnoses:  Need for community resource  Depression, unspecified depression type  Anxiety disorder, unspecified type   History of Present illness: Ronnie Rivera is a 36 y.o. male.  Pt presents voluntarily to [facility] behavioral health for walk-in assessment. Pt is assessed face-to-face by nurse practitioner.   Ronnie Rivera, 36 y.o., male patient seen face to face by this provider, and chart reviewed on 05/01/22.  On evaluation when asked reason for presenting today, pt reports "when things bother my spirit and it feels like I'm told told to do something, it puts me in a state of mind where it feels like my back is against the wall, I go into survival mode". Pt reports depressed, anxious mood. He reports trauma history, with death of mother last year following cancer diagnosis, death of father at the age of 39 y/o due to "murder", death of grandmother in car accident in 2011. Pt reports financial, work, and housing stressors. Pt reports he has been falsely accused of selling narcotics at work. He is currently employed at [employer]. He states he last went to work 2 weeks ago. He states he filed a complaint with EEOC.   Pt denies suicidal, homicidal or violent ideation. He denies auditory visual hallucinations or paranoia.   Pt denies non suicidal self injurious behavior or suicide attempt.   Pt reports marijuana use once a week, 1 to 2 blunts/occasion. He reports use of alcohol once every 2 weeks, 1 beer/occasion. Pt denies use of nicotine. Pt denies crack/cocaine, other substance use.  Pt reports he is not connected with medication  management or counseling. Pt reports last receiving counseling in middle school. He did believe it was helpful at the time.  Pt reports he housing instability and is currently living "in between places". He states he attempted to go to the [facility] for a housing voucher and was told they do not do housing vouchers.   Pt reports he has 2 children, 66 y/o and 8 y/o girls.   Pt is requesting assistance with getting connected to medication management and counseling. He would also like assistance with his disability forms.   Discussed w/ pt recommendation for medication management and counseling. Discussed possibility of referral to PHP/IOP program. Pt would like referral to PHP/IOP, gives his best contact information for referral as [phone], [email or web address]. Discussed [facility] outpatient services. Discussed possibility of requesting to speak with Eligibility Caseworker with DSS about eligibility for medicaid.  Discussed setting up primary care services and [facility]. Pt verbally contracts to safety for himself and others. Pt discharged with resources.  Flowsheet Row ED from 05/01/2022 in [facility] from 04/15/2022 in [facility] ED from [facility]12/20[facility]   C-SSRS RISK CATEGORY No Risk No Risk No Risk       Psychiatric Specialty Exam  Presentation  General Appearance:Appropriate for Environment; Casual; Fairly Groomed  Eye Contact:Fair  Speech:Clear and Coherent; Normal Rate  Speech Volume:Normal  Handedness:Right   Mood and Affect  Mood: Anxious; Depressed  Affect: Blunt   Thought Process  Thought Processes: Coherent  Descriptions of Associations:Intact  Orientation:Full (Time, Place and Person)  Thought Content:Logical  Diagnosis of Schizophrenia or Schizoaffective disorder in past: No   Hallucinations:None  Ideas of  Reference:None  Suicidal Thoughts:No  Homicidal Thoughts:No   Sensorium  Memory: Immediate Good; Recent Good; Remote Good  Judgment: Fair  Insight: Fair   Materials engineer: Fair  Attention Span: Fair  Recall: [place] of Knowledge: Fair  Language: Good   Psychomotor Activity  Psychomotor Activity: Normal   Assets  Assets: Communication Skills; Desire for Improvement; Leisure Time; Physical Health; Resilience; Social Support   Sleep  Sleep: Fair  Number of hours:  7   No data recorded  Physical Exam: Physical Exam Constitutional:      General: He is not in acute distress.    Appearance: Normal appearance. He is not ill-appearing, toxic-appearing or diaphoretic.  Eyes:     General: No scleral icterus. Neurological:     Mental Status: He is alert and oriented to person, place, and time.  Psychiatric:        Attention and Perception: Attention and perception normal.        Mood and Affect: Mood is anxious and depressed. Affect is blunt.        Speech: Speech normal.        Behavior: Behavior normal. Behavior is cooperative.        Thought Content: Thought content normal.        Cognition and Memory: Cognition and memory normal.        Judgment: Judgment normal.    Review of Systems  Constitutional:  Negative for chills and fever.  Respiratory:  Negative for shortness of breath.   Cardiovascular:  Negative for chest pain and palpitations.  Gastrointestinal:  Negative for abdominal pain.  Neurological:  Negative for headaches.  Psychiatric/Behavioral:  Positive for depression. The patient is nervous/anxious.    There were no vitals taken for this visit. There is no height or weight on file to calculate BMI.  Musculoskeletal: Strength & Muscle Tone: within normal limits Gait & Station: normal Patient leans: N/A  [place] [facility] Discharge Disposition for Follow up and Recommendations: Based on my evaluation the patient  does not appear to have an emergency medical condition and can be discharged with resources and follow up care in outpatient services for Medication Management, Partial Hospitalization Program/Intensive Outpatient Program, and Individual Therapy  Tharon Aquas, NP 05/01/2022, 12:46 PM

## 2022-05-01 NOTE — Discharge Instructions (Addendum)
Patient is instructed prior to discharge to: Take all medications as prescribed by his/her mental healthcare provider. Report any adverse effects and or reactions from the medicines to his/her outpatient provider promptly. Keep all scheduled appointments, to ensure that you are getting refills on time and to avoid any interruption in your medication.  If you are unable to keep an appointment call to reschedule.  Be sure to follow-up with resources and follow-up appointments provided.  Patient has been instructed & cautioned: To not engage in alcohol and or illegal drug use while on prescription medicines. In the event of worsening symptoms, patient is instructed to call the crisis hotline, 911 and or go to the nearest ED for appropriate evaluation and treatment of symptoms. To follow-up with his/her primary care provider for your other medical issues, concerns and or health care needs.  Information: -National Suicide Prevention Lifeline 1-800-SUICIDE or 914-259-8425.  -988 offers 24/7 access to trained crisis counselors who can help people experiencing mental health-related distress. People can call or text 988 or chat 988lifeline.org for themselves or if they are worried about a loved one who may need crisis support.   You are scheduled for an assessment for the PHP on Thursday, 12/7 at 10:00 am. This appointment will last approximately one hour and will be virtual via Webex. Please download the [employer] app prior to the appointment. If you need to cancel or reschedule, please call [phone] and leave a voicemail with your name, date of birth, and phone number.

## 2022-05-01 NOTE — Progress Notes (Signed)
Patient presents to the [facility] indicating that he is depressed and states that he has rage issues tht he needs to get help for. He states that he is out of work due a complaint that was made about him by his co-workers. He states that people made false accusations about him. Patient is somewhat evasive when speaking of the event that occurred, but finally indicated that he was accused of seeling narcotics at work. Patient states that he has been out at work for the past two weeks. Patient states that his sleep and appetite are good. He denies SI/HI/Psychosis and states that he has never been suicidal or homicidal. Patient denies drug and alcohol use. Patient states that he needs to be tied into therapy and he is requesting his short-term disability forms to be filled out. Patient is routine.

## 2022-05-02 ENCOUNTER — Ambulatory Visit (HOSPITAL_COMMUNITY): Payer: No Payment, Other

## 2022-05-08 ENCOUNTER — Telehealth (HOSPITAL_COMMUNITY): Payer: Self-pay | Admitting: Licensed Clinical Social Worker

## 2022-05-09 ENCOUNTER — Encounter: Payer: Self-pay | Admitting: *Deleted

## 2022-05-14 ENCOUNTER — Ambulatory Visit (HOSPITAL_COMMUNITY): Payer: No Payment, Other | Admitting: Licensed Clinical Social Worker

## 2022-06-12 ENCOUNTER — Ambulatory Visit (HOSPITAL_COMMUNITY): Payer: No Payment, Other | Admitting: Licensed Clinical Social Worker

## 2022-06-19 ENCOUNTER — Encounter: Payer: Self-pay | Admitting: *Deleted

## 2022-06-24 NOTE — Progress Notes (Signed)
Unable to contact pt by phone, left message with no response over the following week. No PCP visit or update noted in [facility] since first outreach letter sent in November, 2023. 2nd outreach letter sent today with contact info for [facility], Get Care Now flyer, and Medicaid eligibility flyer. Pt has appt with LCSW at [facility] on 07/18/22, which was confirmed in 2nd outreach letter also. SDOH documented from counseling and Urgent care visits 04/15/22 and 05/01/22.

## 2022-07-18 ENCOUNTER — Ambulatory Visit (HOSPITAL_COMMUNITY): Payer: Self-pay | Admitting: Licensed Clinical Social Worker

## 2022-07-18 ENCOUNTER — Encounter (HOSPITAL_COMMUNITY): Payer: Self-pay

## 2022-07-18 ENCOUNTER — Telehealth (HOSPITAL_COMMUNITY): Payer: Self-pay | Admitting: Licensed Clinical Social Worker

## 2022-07-18 NOTE — Telephone Encounter (Signed)
LCSW sent two links to pt phone. Pt did show he logged in at 1410 for 1400 appointment however virtual session indicated pt was muted. LCSW attempted to call pt on cargility and phone went to VM. LCSW left VM to call back office if he was having technical difficulties, At this time it has been told we are no longer aloud to do phone visits only virtual.  LCSW explain in VM that if virtual visit was not to be conducted then pt would have to come in person moving forward.

## 2022-07-24 ENCOUNTER — Encounter: Payer: Self-pay | Admitting: *Deleted

## 2022-10-23 ENCOUNTER — Encounter: Payer: Self-pay | Admitting: *Deleted

## 2022-10-23 NOTE — Progress Notes (Signed)
Pt attended 12/01/21 screening event where b/p was 147/84 and blood sugar was 104. Unable to contact pt by phone at initial f/u on 04/10/22, when pt sent info r/t PCP options, and on 06/19/22 when additional resources sent to pt including Get Care Now flyer, Community primary clinics flyer, English as a second language teacher. Unable to contact pt by phone again today and chart review documents multiple cancelled and no-show appt with original PCP,Stephanie Hudnell, NP, and with Behavioral health (including last visit with [facility] when pt left w/out being seen.) Final letter offering support with PCP contact, potential insurance options (as sent in January) as well as SDOH resources for food, housing and transportation, based on needs last identified on 04/15/22. No additional health equity team follow up planned at this time.

## 2023-02-05 ENCOUNTER — Encounter (HOSPITAL_COMMUNITY): Payer: Self-pay

## 2023-02-05 ENCOUNTER — Ambulatory Visit (HOSPITAL_COMMUNITY)
Admission: EM | Admit: 2023-02-05 | Discharge: 2023-02-05 | Disposition: A | Discharge status: Home / Self Care | Payer: Self-pay

## 2023-02-05 DIAGNOSIS — M7989 Other specified soft tissue disorders: Secondary | ICD-10-CM

## 2023-02-05 DIAGNOSIS — T63421A Toxic effect of venom of ants, accidental (unintentional), initial encounter: Secondary | ICD-10-CM

## 2023-02-05 NOTE — Discharge Instructions (Signed)
Ibuprofen 600-800 mg every 6 hours for pain and swelling [employer] "Equate" brand is very affordable ($1.98 for 100 tablets)  Apply ice for 10-15 minutes at a time (not directly to the skin)  Please return if needed

## 2023-02-05 NOTE — ED Triage Notes (Signed)
Pt presents to the office for right hand swelling. Pt thinks he was bitten by insect.

## 2023-02-05 NOTE — ED Provider Notes (Signed)
[facility]    CSN: [id number] Arrival date & time: 02/05/23  4098      History   Chief Complaint Chief Complaint  Patient presents with   Insect Bite    HPI Ronnie Rivera is a 37 y.o. male.  Swelling of left hand pinky finger, noticed this morning Thinks something bit him Denies injury or trauma Tried soaking in alcohol and warm water  Past Medical History:  Diagnosis Date   Scalp laceration 11/16/2019    Patient Active Problem List   Diagnosis Date Noted   Mild depression 04/15/2022   PTSD (post-traumatic stress disorder) 04/15/2022   GAD (generalized anxiety disorder) 04/15/2022    History reviewed. No pertinent surgical history.     Home Medications    Prior to Admission medications   Medication Sig Start Date End Date Taking? Authorizing Provider  hydrOXYzine (ATARAX) 25 MG tablet Take 1 tablet (25 mg total) by mouth every 6 (six) hours as needed for anxiety. 04/12/22   Gustavus Bryant, FNP    Family History History reviewed. No pertinent family history.  Social History Social History   Tobacco Use   Smoking status: Never   Smokeless tobacco: Never  Vaping Use   Vaping status: Never Used  Substance Use Topics   Alcohol use: Yes    Alcohol/week: 1.0 standard drink of alcohol    Types: 1 Glasses of wine per week    Comment: occ   Drug use: Yes    Types: Marijuana    Comment: uses edibles occasionally.     Allergies   Patient has no known allergies.   Review of Systems Review of Systems Per HPI  Physical Exam Triage Vital Signs ED Triage Vitals [02/05/23 0839]  Encounter Vitals Group     BP 135/83     Systolic BP Percentile      Diastolic BP Percentile      Pulse Rate 63     Resp 16     Temp 98.7 F (37.1 C)     Temp Source Oral     SpO2 98 %     Weight      Height      Head Circumference      Peak Flow      Pain Score      Pain Loc      Pain Education      Exclude from Growth Chart    No data  found.  Updated Vital Signs BP 135/83 (BP Location: Left Arm)   Pulse 63   Temp 98.7 F (37.1 C) (Oral)   Resp 16   SpO2 98%     Physical Exam Vitals and nursing note reviewed.  Constitutional:      General: He is not in acute distress.    Appearance: Normal appearance.  HENT:     Mouth/Throat:     Mouth: Mucous membranes are moist.     Pharynx: Oropharynx is clear.  Cardiovascular:     Rate and Rhythm: Normal rate and regular rhythm.     Heart sounds: Normal heart sounds.  Pulmonary:     Effort: Pulmonary effort is normal.     Breath sounds: Normal breath sounds.  Musculoskeletal:     Comments: Full ROM despite discomfort.  Distal sensation intact, cap refill less than 2 seconds  Skin:    Comments: 2 small pustular areas on the left hand pinky finger.  Consistent with fire ant bites.  There is mild erythema surrounding.  Causing mild swelling of finger  Neurological:     Mental Status: He is alert and oriented to person, place, and time.      UC Treatments / Results  Labs (all labs ordered are listed, but only abnormal results are displayed) Labs Reviewed - No data to display  EKG   Radiology No results found.  Procedures Procedures (including critical care time)  Medications Ordered in UC Medications - No data to display  Initial Impression / Assessment and Plan / UC Course  I have reviewed the triage vital signs and the nursing notes.  Pertinent labs & imaging results that were available during my care of the patient were reviewed by me and considered in my medical decision making (see chart for details).  Fire ant bites causing finger discomfort and swelling this morning Advised ibuprofen, ice. No concern of infection  Stable vitals, clear lungs Return precautions. Patient agreeable to plan Work note provided   Final Clinical Impressions(s) / UC Diagnoses   Final diagnoses:  Swelling of finger, left  Fire ant bite, accidental or unintentional,  initial encounter     Discharge Instructions      Ibuprofen 600-800 mg every 6 hours for pain and swelling [employer] "Equate" brand is very affordable ($1.98 for 100 tablets)  Apply ice for 10-15 minutes at a time (not directly to the skin)  Please return if needed    ED Prescriptions   None    PDMP not reviewed this encounter.   Emika Tiano, Lurena Joiner, [place] 02/05/23 1829

## 2023-04-16 ENCOUNTER — Ambulatory Visit (HOSPITAL_COMMUNITY)
Admission: EM | Admit: 2023-04-16 | Discharge: 2023-04-16 | Disposition: A | Discharge status: Home / Self Care | Payer: Self-pay | Attending: Emergency Medicine | Admitting: Emergency Medicine

## 2023-04-16 ENCOUNTER — Encounter (HOSPITAL_COMMUNITY): Payer: Self-pay

## 2023-04-16 ENCOUNTER — Telehealth: Payer: Self-pay

## 2023-04-16 ENCOUNTER — Ambulatory Visit (INDEPENDENT_AMBULATORY_CARE_PROVIDER_SITE_OTHER): Payer: Self-pay

## 2023-04-16 DIAGNOSIS — M79604 Pain in right leg: Secondary | ICD-10-CM

## 2023-04-16 DIAGNOSIS — R0602 Shortness of breath: Secondary | ICD-10-CM

## 2023-04-16 DIAGNOSIS — R2241 Localized swelling, mass and lump, right lower limb: Secondary | ICD-10-CM

## 2023-04-16 DIAGNOSIS — M7989 Other specified soft tissue disorders: Secondary | ICD-10-CM

## 2023-04-16 DIAGNOSIS — M79661 Pain in right lower leg: Secondary | ICD-10-CM

## 2023-04-16 NOTE — ED Provider Notes (Signed)
[facility]    CSN: [id number] Arrival date & time: 04/16/23  1257      History   Chief Complaint Chief Complaint  Patient presents with   Leg Pain   Shortness of Breath    HPI Ronnie Rivera is a 37 y.o. male.   Patient presents to clinic complaining of right distal thigh pain for the past 3 days.  He also feels like his right calf is more swollen than the left.  He feels like he has been little short of breath and congested over the past few days as well and would like to get his chest plate evaluated.  He has hardware in his right femur as well from a traumatic accident in 2011.  Thinks his symptoms are due to stress.  Reports the mother of his children is battling stage IV liver cancer and the anniversary of his mother's death from cancer is soon.   The history is provided by the patient and medical records.  Leg Pain Shortness of Breath   Past Medical History:  Diagnosis Date   Scalp laceration 11/16/2019    Patient Active Problem List   Diagnosis Date Noted   Mild depression 04/15/2022   PTSD (post-traumatic stress disorder) 04/15/2022   GAD (generalized anxiety disorder) 04/15/2022    History reviewed. No pertinent surgical history.     Home Medications    Prior to Admission medications   Not on File    Family History History reviewed. No pertinent family history.  Social History Social History   Tobacco Use   Smoking status: Never   Smokeless tobacco: Never  Vaping Use   Vaping status: Never Used  Substance Use Topics   Alcohol use: Yes    Alcohol/week: 1.0 standard drink of alcohol    Types: 1 Glasses of wine per week    Comment: occ   Drug use: Yes    Types: Marijuana    Comment: uses edibles occasionally.     Allergies   Patient has no known allergies.   Review of Systems Review of Systems  Per HPI   Physical Exam Triage Vital Signs ED Triage Vitals  Encounter Vitals Group     BP 04/16/23 1340 137/84      Systolic BP Percentile --      Diastolic BP Percentile --      Pulse Rate 04/16/23 1340 72     Resp 04/16/23 1340 16     Temp 04/16/23 1340 98.9 F (37.2 C)     Temp Source 04/16/23 1340 Oral     SpO2 04/16/23 1340 97 %     Weight 04/16/23 1344 220 lb (99.8 kg)     Height 04/16/23 1344 5\' 9"  (1.753 m)     Head Circumference --      Peak Flow --      Pain Score 04/16/23 1343 8     Pain Loc --      Pain Education --      Exclude from Growth Chart --    No data found.  Updated Vital Signs BP 137/84 (BP Location: Left Arm)   Pulse 72   Temp 98.9 F (37.2 C) (Oral)   Resp 16   Ht 5\' 9"  (1.753 m)   Wt 220 lb (99.8 kg)   SpO2 97%   BMI 32.49 kg/m   Visual Acuity Right Eye Distance:   Left Eye Distance:   Bilateral Distance:    Right Eye Near:   Left  Eye Near:    Bilateral Near:     Physical Exam Vitals and nursing note reviewed.  Constitutional:      Appearance: Normal appearance. He is well-developed.  HENT:     Head: Normocephalic and atraumatic.     Right Ear: External ear normal.     Left Ear: External ear normal.     Nose: Nose normal.     Mouth/Throat:     Mouth: Mucous membranes are moist.  Eyes:     Conjunctiva/sclera: Conjunctivae normal.  Cardiovascular:     Rate and Rhythm: Normal rate and regular rhythm.     Heart sounds: Normal heart sounds. No murmur heard. Pulmonary:     Effort: Pulmonary effort is normal. No tachypnea.     Breath sounds: Normal breath sounds. No decreased breath sounds or wheezing.  Musculoskeletal:        General: Normal range of motion.  Skin:    General: Skin is warm and dry.  Neurological:     General: No focal deficit present.     Mental Status: He is alert.  Psychiatric:        Mood and Affect: Mood normal.      UC Treatments / Results  Labs (all labs ordered are listed, but only abnormal results are displayed) Labs Reviewed - No data to display  EKG   Radiology No results  found.  Procedures Procedures (including critical care time)  Medications Ordered in UC Medications - No data to display  Initial Impression / Assessment and Plan / UC Course  I have reviewed the triage vital signs and the nursing notes.  Pertinent labs & imaging results that were available during my care of the patient were reviewed by me and considered in my medical decision making (see chart for details).  Vitals and triage reviewed, patient is hemodynamically stable.  Hardware appears intact on imaging.  Chest x-ray does not reveal any obvious infiltrate.  Reports right calf swelling, no obvious swelling or erythema noted.  No calf pain with ambulation.  Does have hardware in this leg, will obtain ultrasound to rule out DVT.  Imaging of hardware shows no loosening or malposition.  Symptoms may be related to stress and anxiety, reassurance provided.  Plan of care, follow-up care and return precautions given, no questions at this time.     Final Clinical Impressions(s) / UC Diagnoses   Final diagnoses:  Right leg pain  Calf swelling  Shortness of breath     Discharge Instructions      I do not see anything grossly abnormal on your images.  I will contact you if your x-ray interpretation is any different.  We have sent for an ultrasound of your right leg to rule out a blood clot.  Seek immediate care if you develop worsening shortness of breath, any new concerning symptoms.     ED Prescriptions   None    PDMP not reviewed this encounter.   Cala Kruckenberg, [place], [place] 04/16/23 [id number]

## 2023-04-16 NOTE — Discharge Instructions (Addendum)
I do not see anything grossly abnormal on your images.  I will contact you if your x-ray interpretation is any different.  We have sent for an ultrasound of your right leg to rule out a blood clot.  Seek immediate care if you develop worsening shortness of breath, any new concerning symptoms.

## 2023-04-16 NOTE — ED Notes (Signed)
First available [place] for RLE 04/17/2023 at 10:00 am. Patient has been scheduled.

## 2023-04-16 NOTE — ED Triage Notes (Signed)
Patient here today with c/o posterior right leg pain X 3 days. Patient states that he has a rod in the upper right leg.   Patient also c/o trouble breathing and congestion X 2 days.

## 2023-04-17 ENCOUNTER — Ambulatory Visit (HOSPITAL_COMMUNITY)
Admission: RE | Admit: 2023-04-17 | Discharge: 2023-04-17 | Disposition: A | Discharge status: Home / Self Care | Payer: Self-pay | Source: Ambulatory Visit | Attending: Emergency Medicine | Admitting: Emergency Medicine

## 2023-04-17 DIAGNOSIS — M7989 Other specified soft tissue disorders: Secondary | ICD-10-CM | POA: Insufficient documentation

## 2023-07-16 ENCOUNTER — Encounter (HOSPITAL_COMMUNITY): Payer: Self-pay | Admitting: *Deleted

## 2023-07-16 ENCOUNTER — Encounter (HOSPITAL_COMMUNITY): Payer: Self-pay | Admitting: Behavioral Health

## 2023-07-16 ENCOUNTER — Ambulatory Visit (HOSPITAL_COMMUNITY)
Admission: EM | Admit: 2023-07-16 | Discharge: 2023-07-16 | Disposition: A | Discharge status: Home / Self Care | Payer: Medicaid Other | Attending: Psychiatry | Admitting: Psychiatry

## 2023-07-16 ENCOUNTER — Emergency Department (HOSPITAL_COMMUNITY)
Admission: EM | Admit: 2023-07-16 | Discharge: 2023-07-16 | Disposition: A | Discharge status: Home / Self Care | Payer: Medicaid Other

## 2023-07-16 ENCOUNTER — Emergency Department (HOSPITAL_COMMUNITY): Payer: Medicaid Other

## 2023-07-16 ENCOUNTER — Other Ambulatory Visit: Payer: Self-pay

## 2023-07-16 DIAGNOSIS — M25551 Pain in right hip: Secondary | ICD-10-CM | POA: Diagnosis present

## 2023-07-16 DIAGNOSIS — M79604 Pain in right leg: Secondary | ICD-10-CM | POA: Diagnosis not present

## 2023-07-16 DIAGNOSIS — F330 Major depressive disorder, recurrent, mild: Secondary | ICD-10-CM | POA: Insufficient documentation

## 2023-07-16 DIAGNOSIS — Z5900 Homelessness unspecified: Secondary | ICD-10-CM | POA: Insufficient documentation

## 2023-07-16 DIAGNOSIS — Z6379 Other stressful life events affecting family and household: Secondary | ICD-10-CM | POA: Insufficient documentation

## 2023-07-16 DIAGNOSIS — M79605 Pain in left leg: Secondary | ICD-10-CM | POA: Diagnosis not present

## 2023-07-16 DIAGNOSIS — M25552 Pain in left hip: Secondary | ICD-10-CM | POA: Insufficient documentation

## 2023-07-16 DIAGNOSIS — F411 Generalized anxiety disorder: Secondary | ICD-10-CM | POA: Insufficient documentation

## 2023-07-16 DIAGNOSIS — F4310 Post-traumatic stress disorder, unspecified: Secondary | ICD-10-CM | POA: Insufficient documentation

## 2023-07-16 DIAGNOSIS — Z789 Other specified health status: Secondary | ICD-10-CM

## 2023-07-16 MED ORDER — KETOROLAC TROMETHAMINE 15 MG/ML IJ SOLN
15.0000 mg | Freq: Once | INTRAMUSCULAR | Status: AC
  Administered 2023-07-16: 15 mg via INTRAMUSCULAR
  Filled 2023-07-16: qty 1

## 2023-07-16 MED ORDER — ACETAMINOPHEN 500 MG PO TABS
1000.0000 mg | ORAL_TABLET | Freq: Once | ORAL | Status: AC
  Administered 2023-07-16: 1000 mg via ORAL
  Filled 2023-07-16: qty 2

## 2023-07-16 NOTE — Discharge Instructions (Addendum)
 Shelter List:   [facility]  (Please Call) Phone: [phone]   Provides housing and specialized case management focused on housing stability.    [place] [facility] NIGHT SHELTER) [address] [place], [place] Phone: [phone]   [facility] has been providing emergency shelter to those in need of a permanent residence for over 35 years. The [employer] shelter plays an important role in our community.   There are many life events that can pull someone into a downward spiral towards poverty that is very difficult to get out of. Homelessness is a problem that can affect anyone of [place]. [employer] is a safe and comforting place to stay, especially if you have experienced the hardship of street life.   [employer] provides a single bed and bedding to 100 adult men and women. The shelter welcomes all who are in need of housing, no one in real need is turned away unless space is not available.   While staying at [facility], guests are offered more than just a bed for a night. Hot meals are provided and every guest has access to case management services. Case managers provide assistance with finding housing, employment, or other services that will help them gain stability. Continuous stay is based on availability, capacity, and progress towards goals.   To contact the front desk of [facility] please call   [phone] ext [phone] or ext. [phone].   [facility] [address] [place], [place] [address] Phone: [phone]   [facility] (Women only) [address] [place], [place] [address] Phone: [phone]   [facility] Address: [address] [place], [place] [address] Hours:   Opens 9?AM Mon Phone: [phone]  [facility] providers a continuum of housing services to those experiencing homelessness. They provide transitional [employer] and permanent  supportive housing ([place]) to disabled veterans experiencing homelessness. There is a fast track Rapid Rehousing program couples housing stability services with temporary financial assistance to quickly house individuals and families experiencing homelessness.   [employer] [address], [place] [address] Phone: [phone]   [facility] [place] [address] Mikle Bosworth [place], [place] [address] Phone: [phone]   [facility] [address], [place], [place] [address] Check in at 6:00PM for placement at a local shelter) Phone: [phone]   [facility] Eligibility:  Must be drug and alcohol free for at least 14 days or more at the time of application. This program serves males.  Houses Engineer, civil (consulting), Economist who serve six-month terms.  Houses are financially self-supporting; members split house expenses, which average $90.00 to $130.00 per person per week.  Any Resident who relapses must be immediately expelled. Call:  [phone]   [facility]  Address: [address] [place], [place] [address]  Phone#: [phone] [facility] Address: [address] [place], [place] [address] Phone: [phone]  -[facility] provides food, shelter and other programs and services to the homeless men of [place] through our [employer] program.  By offering safe shelter, three meals a day, clean clothing, Biblical counseling, financial planning, vocational training, GED/education and employment assistance, we've helped mend the shattered lives of many homeless men since opening in [place].  We have approximately 267 beds available, with a max of 312 beds including mats for  emergency situations and currently house an average of 270 men a night.  Prospective Client Check-In Information Photo ID Required (State/ Out  of State/ [facility]) - if photo ID is not available, clients are required to have a printout of a police/sheriff's criminal history report. Help out with chores around the [place]. No sex offender of any type (pending, charged, registered and/or any other sex related offenses) will be permitted to check in. Must be willing to abide by all rules, regulations, and policies established by the [employer]. The following will be provided - shelter, food, clothing, and biblical counseling. If you or someone you know is in need of assistance at our [facility] shelter in [place], [place], please call [phone] ext. [phone].  Women Shelter for [employer] hours are Monday-Friday only.       Outpatient Services for Therapy and Medication Management   Based on what you have shared, a list of resources for outpatient therapy and psychiatry is provided below to get you started back on treatment.  It is imperative that you follow through with treatment within 5-7 days from the day of discharge to prevent any further risk to your safety or mental well-being.  You are not limited to the list provided.  In case of an urgent crisis, you may contact the Mobile Crisis Unit with [facility] at [phone].         [facility] [address], [place], [address] [phone] phone  New Patient Assessment/Therapy Walk-ins Monday and Wednesday: 8am until slots are full. Every 1st and 2nd Friday: 1pm - 5pm  NO ASSESSMENT/THERAPY WALK-INS ON TUESDAYS OR THURSDAYS  New Patient Psychiatry/Medication Management Walk-ins Monday-Friday: 8am-11am  For all walk-ins, we ask that you arrive by 7:30am because patient will be seen in the order of arrival.  Availability is limited; therefore, you may not be seen on the same day that you walk-in.  Our goal is to serve and meet the needs of our community to the best of our ability.   [facility] [address] [place], [place], [address] [phone] phone  [facility] [address] [place], [place], [address] [phone] phone [phone] phone ([address], Anthem/Elevance, [address], [address], [address], [address], Healthy [place], [place], [place], [address], [employer]oco[employer], [place], UHC, American Financial, [place], Out of Network)  [employer] [place] [address] [place], [place], [address] [phone] phone ([place], Anthem/Elevance, [employer] Options/Carelon, BCBS, [address], [place], [place], [place], [place], [employer], [place], [place], [place], [facility])  [employer] [address] [place], [place], [address] [phone] phone (Medicaid, ask about other insurance)  [facility] [address] [place], [place], [address] [phone] phone [phone] fax ([address], [place] , [place], [place], [place] Health Choice, UHC, General Electric, Self-Pay)  [facility] [address] [facility] [address] [place], [place], [address] [phone] phone ([address] Anthem/Elevance, [address], [address], [place], [employer] Corporation, [place], [place], [place], [employer], [place], [place], [place], [facility])  [employer] [facility] - 6-8 MONTH WAIT FOR THERAPY; SOONER FOR MEDICATION MANAGEMENT [address] [place], [place], [address] [phone] phone ([address] [employer]Health [address] - , [address], [place], [place], Friday Health Plans, [address], BCBS Healthy [place], [place], [address], [place], [place], [place], [place], Tricare, UHC, Safeco Corporation, [place])  [facility] [address] [place], [place], [address] [phone] phone  [facility] [address] Suite [phone]  [place], [place], [phone] phone  [facility] [address] [place], [place], [address] [phone]  phone  [facility] [place] - THERAPY ONLY [address] [place], [address] [phone] phone  [facility] [place] [address], [place], [address] [phone] phone  [facility] [address] [place], [place], [address] [phone] phone [phone] fax  [facility] [address] [place], [place], [address] [phone] phone [phone] fax  [facility] [phone] [address] [place], [place], [address] [phone] phone  Jovita Kussmaul [address] [place], [place], [address]  [phone] phone  [facility]  (Adults Only) [address] [place], [place], [address]  [phone] phone [phone] fax
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
## 2023-07-16 NOTE — ED Notes (Addendum)
 Patient Is discharging at this time. Printed AVS reviewed with patient along with resources for sheltering and therapy. Patient denies SI, HI, and A/V/H. Valuables/belongings returned to patient. No s/s of current distress.

## 2023-07-16 NOTE — ED Provider Notes (Signed)
  [place] EMERGENCY DEPARTMENT AT [facility] Provider Note   CSN: [id number] Arrival date & time: 07/16/23  1819     History Chief Complaint  Patient presents with   Hip Pain    Ronnie Rivera is a 38 y.o. male.   Hip Pain       Home Medications Prior to Admission medications   Not on File      Allergies    Patient has no known allergies.    Review of Systems   Review of Systems  Physical Exam Updated Vital Signs BP (!) 140/89   Pulse 76   Temp 98.2 F (36.8 C) (Oral)   Resp 16   Ht 5\' 9"  (1.753 m)   Wt 99.8 kg   SpO2 98%   BMI 32.49 kg/m  Physical Exam  ED Results / Procedures / Treatments   Labs (all labs ordered are listed, but only abnormal results are displayed) Labs Reviewed - No data to display  EKG None  Radiology DG HIP UNILAT WITH PELVIS 2-3 VIEWS RIGHT Result Date: 07/16/2023 CLINICAL DATA:  Bilateral hip pain. EXAM: DG HIP (WITH OR WITHOUT PELVIS) 2-3V RIGHT COMPARISON:  Femur radiograph 06/15/2022 FINDINGS: The right hip joint space is preserved. Femoral head is well seated. No fracture or erosion. No evidence of avascular necrosis. Femoral intramedullary nail with proximal and distal locking screw fixation. Hardware is intact without periprosthetic lucency. IMPRESSION: 1. Unremarkable radiographic appearance of the right hip. 2. Intact right femoral hardware without complication. Electronically Signed   By: Narda Rutherford M.D.   On: 07/16/2023 21:04   DG HIP UNILAT WITH PELVIS 2-3 VIEWS LEFT Result Date: 07/16/2023 CLINICAL DATA:  Bilateral hip pain. EXAM: DG HIP (WITH OR WITHOUT PELVIS) 2-3V LEFT COMPARISON:  Left femur 01/21/2010. FINDINGS: No acute or degenerative findings. Partially imaged healed fracture of the left femoral shaft. Partially imaged intramedullary rod in the proximal right femur. IMPRESSION: No acute or degenerative findings. Electronically Signed   By: Leanna Battles M.D.   On: 07/16/2023 21:02     Procedures Procedures   Medications Ordered in ED Medications - No data to display  ED Course/ Medical Decision Making/ A&P                                Medical Decision Making Amount and/or Complexity of Data Reviewed Radiology: ordered.    Final Clinical Impression(s) / ED Diagnoses Final diagnoses:  None    Rx / DC Orders ED Discharge Orders     None

## 2023-07-16 NOTE — Discharge Instructions (Addendum)
 You were seen in the ER today for evaluation of your achy legs and hips. This is likely from you walking more. Please make sure you follow up with your surgeon. For pain, you can take 1000mg  of Tylenol and 600mg  of ibuprofen every 6 hours as needed for pain. Try to rest to help with pain. Please follow up for your ultrasound tomorrow. I have included more information for you to review. If you have any concerns, new or worsening symptoms, please return to the ER for re-evaluation.   Contact a health care provider if: You cannot put weight on your leg. Your pain or swelling gets worse after a week. It gets harder to walk. You have a fever. Get help right away if: You fall. You have a sudden increase in pain and swelling in your hip. Your hip is red or swollen or very tender to touch.
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
## 2023-07-17 ENCOUNTER — Ambulatory Visit (HOSPITAL_COMMUNITY): Payer: Medicaid Other

## 2023-07-22 ENCOUNTER — Encounter (HOSPITAL_COMMUNITY): Payer: Self-pay | Admitting: Student

## 2023-07-22 ENCOUNTER — Ambulatory Visit (INDEPENDENT_AMBULATORY_CARE_PROVIDER_SITE_OTHER): Payer: Medicaid Other | Admitting: Student

## 2023-07-22 DIAGNOSIS — Z758 Other problems related to medical facilities and other health care: Secondary | ICD-10-CM

## 2023-07-22 DIAGNOSIS — F4322 Adjustment disorder with anxiety: Secondary | ICD-10-CM | POA: Insufficient documentation

## 2023-07-22 NOTE — Patient Instructions (Signed)
 [facility] Phone: [phone] Address: [address] Entrance A, Ground Floor - [facility], [place], [place] [address] Hours: Monday to Thursday; 8:15PM - 5PM. FridayChana Bode - 5PM  [facility] Phone:  [phone] Address: [address] [place], [place], [place] [address] Hours: Monday to Friday; 8:30AM - 5PM  [facility] Phone: [phone]  Address: [address], [place], [place] [address] Hours: Monday to Friday; 8:30AM - 5:30PM
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
## 2023-07-23 ENCOUNTER — Ambulatory Visit (INDEPENDENT_AMBULATORY_CARE_PROVIDER_SITE_OTHER): Payer: Medicaid Other | Admitting: Mental Health

## 2023-07-23 ENCOUNTER — Ambulatory Visit (HOSPITAL_COMMUNITY): Payer: Medicaid Other | Admitting: Licensed Clinical Social Worker

## 2023-07-23 DIAGNOSIS — F4322 Adjustment disorder with anxiety: Secondary | ICD-10-CM | POA: Diagnosis not present
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
## 2023-07-25 ENCOUNTER — Ambulatory Visit (HOSPITAL_COMMUNITY)
Admission: EM | Admit: 2023-07-25 | Discharge: 2023-07-27 | Disposition: A | Discharge status: Other Acute Inpatient Hospital | Payer: Medicaid Other | Attending: Urology | Admitting: Urology

## 2023-07-25 DIAGNOSIS — F4321 Adjustment disorder with depressed mood: Secondary | ICD-10-CM | POA: Insufficient documentation

## 2023-07-25 DIAGNOSIS — F322 Major depressive disorder, single episode, severe without psychotic features: Secondary | ICD-10-CM | POA: Insufficient documentation

## 2023-07-25 DIAGNOSIS — F4325 Adjustment disorder with mixed disturbance of emotions and conduct: Secondary | ICD-10-CM | POA: Insufficient documentation

## 2023-07-25 DIAGNOSIS — F332 Major depressive disorder, recurrent severe without psychotic features: Secondary | ICD-10-CM | POA: Insufficient documentation

## 2023-07-25 DIAGNOSIS — R45851 Suicidal ideations: Secondary | ICD-10-CM | POA: Insufficient documentation

## 2023-07-25 LAB — POCT URINE DRUG SCREEN - MANUAL ENTRY (I-SCREEN)
POC Amphetamine UR: NOT DETECTED
POC Buprenorphine (BUP): NOT DETECTED
POC Cocaine UR: NOT DETECTED
POC Marijuana UR: POSITIVE — AB
POC Methadone UR: NOT DETECTED
POC Methamphetamine UR: NOT DETECTED
POC Morphine: NOT DETECTED
POC Oxazepam (BZO): NOT DETECTED
POC Oxycodone UR: NOT DETECTED
POC Secobarbital (BAR): NOT DETECTED

## 2023-07-25 LAB — HEMOGLOBIN A1C
Hgb A1c MFr Bld: 4.4 % — ABNORMAL LOW (ref 4.8–5.6)
Mean Plasma Glucose: 79.58 mg/dL

## 2023-07-25 LAB — CBC WITH DIFFERENTIAL/PLATELET
Abs Immature Granulocytes: 0.01 10*3/uL (ref 0.00–0.07)
Basophils Absolute: 0 10*3/uL (ref 0.0–0.1)
Basophils Relative: 0 %
Eosinophils Absolute: 0 10*3/uL (ref 0.0–0.5)
Eosinophils Relative: 1 %
HCT: 40.5 % (ref 39.0–52.0)
Hemoglobin: 13.9 g/dL (ref 13.0–17.0)
Immature Granulocytes: 0 %
Lymphocytes Relative: 38 %
Lymphs Abs: 1.7 10*3/uL (ref 0.7–4.0)
MCH: 31.2 pg (ref 26.0–34.0)
MCHC: 34.3 g/dL (ref 30.0–36.0)
MCV: 90.8 fL (ref 80.0–100.0)
Monocytes Absolute: 0.5 10*3/uL (ref 0.1–1.0)
Monocytes Relative: 10 %
Neutro Abs: 2.3 10*3/uL (ref 1.7–7.7)
Neutrophils Relative %: 51 %
Platelets: 276 10*3/uL (ref 150–400)
RBC: 4.46 MIL/uL (ref 4.22–5.81)
RDW: 11.8 % (ref 11.5–15.5)
WBC: 4.5 10*3/uL (ref 4.0–10.5)
nRBC: 0 % (ref 0.0–0.2)

## 2023-07-25 LAB — COMPREHENSIVE METABOLIC PANEL
ALT: 47 U/L — ABNORMAL HIGH (ref 0–44)
AST: 30 U/L (ref 15–41)
Albumin: 3.7 g/dL (ref 3.5–5.0)
Alkaline Phosphatase: 59 U/L (ref 38–126)
Anion gap: 9 (ref 5–15)
BUN: 9 mg/dL (ref 6–20)
CO2: 27 mmol/L (ref 22–32)
Calcium: 9 mg/dL (ref 8.9–10.3)
Chloride: 105 mmol/L (ref 98–111)
Creatinine, Ser: 1.04 mg/dL (ref 0.61–1.24)
GFR, Estimated: >60 mL/min (ref >60)
Glucose, Bld: 74 mg/dL (ref 70–99)
Potassium: 4.4 mmol/L (ref 3.5–5.1)
Sodium: 141 mmol/L (ref 135–145)
Total Bilirubin: 0.7 mg/dL (ref 0.0–1.2)
Total Protein: 5.5 g/dL — ABNORMAL LOW (ref 6.5–8.1)

## 2023-07-25 LAB — LIPID PANEL
Cholesterol: 159 mg/dL (ref 0–200)
HDL: 48 mg/dL (ref >40)
LDL Cholesterol: 96 mg/dL (ref 0–99)
Total CHOL/HDL Ratio: 3.3 ratio
Triglycerides: 76 mg/dL (ref <150)
VLDL: 15 mg/dL (ref 0–40)

## 2023-07-25 LAB — TSH: TSH: 0.858 u[IU]/mL (ref 0.350–4.500)

## 2023-07-25 LAB — ETHANOL: Alcohol, Ethyl (B): <10 mg/dL (ref <10)

## 2023-07-25 MED ORDER — LORAZEPAM 2 MG/ML IJ SOLN: 2.0000 mg | Freq: Three times a day (TID) | INTRAMUSCULAR | Status: DC | PRN

## 2023-07-25 MED ORDER — HYDROXYZINE HCL 25 MG PO TABS: 25.0000 mg | ORAL_TABLET | Freq: Three times a day (TID) | ORAL | Status: DC | PRN

## 2023-07-25 MED ORDER — DIPHENHYDRAMINE HCL 50 MG/ML IJ SOLN: 50.0000 mg | Freq: Three times a day (TID) | INTRAMUSCULAR | Status: DC | PRN

## 2023-07-25 MED ORDER — ALUM & MAG HYDROXIDE-SIMETH 200-200-20 MG/5ML PO SUSP: 30.0000 mL | ORAL | Status: DC | PRN

## 2023-07-25 MED ORDER — ACETAMINOPHEN 325 MG PO TABS
650.0000 mg | ORAL_TABLET | Freq: Four times a day (QID) | ORAL | Status: DC | PRN
  Administered 2023-07-26: 650 mg via ORAL
  Filled 2023-07-25: qty 2

## 2023-07-25 MED ORDER — HALOPERIDOL 5 MG PO TABS: 5.0000 mg | ORAL_TABLET | Freq: Three times a day (TID) | ORAL | Status: DC | PRN

## 2023-07-25 MED ORDER — MAGNESIUM HYDROXIDE 400 MG/5ML PO SUSP: 30.0000 mL | Freq: Every day | ORAL | Status: DC | PRN

## 2023-07-25 MED ORDER — DIPHENHYDRAMINE HCL 50 MG PO CAPS: 50.0000 mg | ORAL_CAPSULE | Freq: Three times a day (TID) | ORAL | Status: DC | PRN

## 2023-07-25 MED ORDER — HALOPERIDOL LACTATE 5 MG/ML IJ SOLN: 5.0000 mg | Freq: Three times a day (TID) | INTRAMUSCULAR | Status: DC | PRN

## 2023-07-25 MED ORDER — TRAZODONE HCL 50 MG PO TABS: 50.0000 mg | ORAL_TABLET | Freq: Every evening | ORAL | Status: DC | PRN

## 2023-07-25 MED ORDER — HALOPERIDOL LACTATE 5 MG/ML IJ SOLN: 10.0000 mg | Freq: Three times a day (TID) | INTRAMUSCULAR | Status: DC | PRN

## 2023-07-25 NOTE — Addendum Note (Signed)
 Addended by: Theodoro Kos A on: 07/25/2023 08:54 AM   Modules accepted: Level of Service
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
## 2023-07-25 NOTE — ED Notes (Signed)
 Patient resting quietly in bed with eyes closed. Respirations equal and unlabored, skin warm and dry, NAD. Routine safety checks conducted according to facility protocol. Will continue to monitor for safety.
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
## 2023-07-25 NOTE — ED Notes (Signed)
 Patient A&Ox4. Patient reports SI with a plan to end his life by "starving" himself to death.  Patient denies HI and AVH. Patient oriented to unit. Meal and beverage given. Patient denies any physical complaints when asked. No acute distress noted. Support and encouragement provided. Routine safety checks conducted according to facility protocol. Encouraged patient to notify staff if thoughts of harm toward self or others arise. Patient verbalize understanding and agreement. Will continue to monitor for safety.

## 2023-07-26 DIAGNOSIS — F4325 Adjustment disorder with mixed disturbance of emotions and conduct: Secondary | ICD-10-CM | POA: Insufficient documentation

## 2023-07-26 DIAGNOSIS — F4321 Adjustment disorder with depressed mood: Secondary | ICD-10-CM | POA: Diagnosis not present

## 2023-07-26 DIAGNOSIS — F322 Major depressive disorder, single episode, severe without psychotic features: Secondary | ICD-10-CM | POA: Insufficient documentation

## 2023-07-26 DIAGNOSIS — R45851 Suicidal ideations: Secondary | ICD-10-CM | POA: Diagnosis not present

## 2023-07-26 DIAGNOSIS — F332 Major depressive disorder, recurrent severe without psychotic features: Secondary | ICD-10-CM | POA: Diagnosis not present

## 2023-07-26 MED ORDER — HYDROXYZINE HCL 25 MG PO TABS
25.0000 mg | ORAL_TABLET | Freq: Four times a day (QID) | ORAL | Status: DC | PRN
Start: 2023-07-26 — End: 2023-07-29

## 2023-07-26 MED ORDER — LOPERAMIDE HCL 2 MG PO CAPS: 2.0000 mg | ORAL_CAPSULE | ORAL | Status: DC | PRN

## 2023-07-26 MED ORDER — LORAZEPAM 1 MG PO TABS: 1.0000 mg | ORAL_TABLET | Freq: Four times a day (QID) | ORAL | Status: DC | PRN

## 2023-07-26 MED ORDER — ADULT MULTIVITAMIN W/MINERALS CH
1.0000 | ORAL_TABLET | Freq: Every day | ORAL | Status: DC
Start: 2023-07-26 — End: 2023-07-27
  Administered 2023-07-26: 1 via ORAL
  Filled 2023-07-26: qty 1

## 2023-07-26 MED ORDER — ONDANSETRON 4 MG PO TBDP
4.0000 mg | ORAL_TABLET | Freq: Four times a day (QID) | ORAL | Status: DC | PRN
Start: 2023-07-26 — End: 2023-07-29

## 2023-07-26 MED ORDER — THIAMINE MONONITRATE 100 MG PO TABS: 100.0000 mg | ORAL_TABLET | Freq: Every day | ORAL | Status: DC

## 2023-07-26 NOTE — Progress Notes (Signed)
 [facility] LCSW Progress Note   07/26/2023    1:10 PM  EDELMIRO INNOCENT   [id number]   Type of Contact and Topic:  Psychiatric Bed Placement   Pt accepted to [facility] 303-2    Patient meets inpatient criteria per Vernard Gambles, NP  The attending provider will be Dr. Phineas Inches   Call report to [phone]    Roseanne Reno, RN @ [facility] notified.     Pt scheduled  to arrive at [facility] TODAY @ 2100.    Damita Dunnings, MSW, LCSW-A  1:12 PM 07/26/2023

## 2023-07-26 NOTE — ED Notes (Signed)
 Patient aware of pending transfer to [facility]. Patient ate dinner and remains cooperative in unit. No s/s of current distress.

## 2023-07-26 NOTE — ED Notes (Signed)
 Patient is A&Ox4. Patient endorses passive SI this morning with no plan. Patient denies HI and A/V/H. Patient denies any pain or discomfort and was provided with breakfast. Patient remains cooperative at this time.
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
## 2023-07-26 NOTE — ED Notes (Signed)
 Patient's pain improving gradually. Pain level 2 out of 10. Patient observed socializing and resting. No s/s of current distress.

## 2023-07-26 NOTE — ED Notes (Signed)
 Patient resting quietly in bed with eyes closed. Respirations equal and unlabored, skin warm and dry, NAD. Routine safety checks conducted according to facility protocol. Will continue to monitor for safety.

## 2023-07-26 NOTE — Discharge Instructions (Addendum)
 Transfer to [facility] for inpatient psychiatric admission, Dr. Alto Denver is the accepting MD

## 2023-07-26 NOTE — ED Notes (Signed)
 Patient socializing with peer and ate lunch. Patient appears flirtatious at times but able to be redirected. Patient remains cooperative in unit. No s/s of current distress.

## 2023-07-26 NOTE — ED Notes (Signed)
Safe Transport requested to [facility]. 

## 2023-07-26 NOTE — ED Notes (Signed)
 Report called to RN Rada Hay, [facility]  Pending [facility] Transport.

## 2023-07-27 ENCOUNTER — Other Ambulatory Visit: Payer: Self-pay

## 2023-07-27 ENCOUNTER — Encounter (HOSPITAL_COMMUNITY): Payer: Self-pay | Admitting: Psychiatry

## 2023-07-27 ENCOUNTER — Inpatient Hospital Stay (HOSPITAL_COMMUNITY)
Admission: AD | Admit: 2023-07-27 | Discharge: 2023-07-29 | DRG: 882 | Disposition: A | Discharge status: Home / Self Care | Source: Intra-hospital | Attending: Psychiatry | Admitting: Psychiatry

## 2023-07-27 DIAGNOSIS — Z604 Social exclusion and rejection: Secondary | ICD-10-CM | POA: Diagnosis present

## 2023-07-27 DIAGNOSIS — Z5900 Homelessness unspecified: Secondary | ICD-10-CM | POA: Diagnosis not present

## 2023-07-27 DIAGNOSIS — R45850 Homicidal ideations: Secondary | ICD-10-CM | POA: Diagnosis present

## 2023-07-27 DIAGNOSIS — F322 Major depressive disorder, single episode, severe without psychotic features: Secondary | ICD-10-CM | POA: Diagnosis present

## 2023-07-27 DIAGNOSIS — F4321 Adjustment disorder with depressed mood: Secondary | ICD-10-CM | POA: Diagnosis present

## 2023-07-27 DIAGNOSIS — R45851 Suicidal ideations: Secondary | ICD-10-CM | POA: Diagnosis present

## 2023-07-27 DIAGNOSIS — Z765 Malingerer [conscious simulation]: Secondary | ICD-10-CM | POA: Diagnosis not present

## 2023-07-27 DIAGNOSIS — Z5941 Food insecurity: Secondary | ICD-10-CM | POA: Diagnosis not present

## 2023-07-27 DIAGNOSIS — Z79899 Other long term (current) drug therapy: Secondary | ICD-10-CM | POA: Diagnosis not present

## 2023-07-27 DIAGNOSIS — F4325 Adjustment disorder with mixed disturbance of emotions and conduct: Principal | ICD-10-CM | POA: Diagnosis present

## 2023-07-27 DIAGNOSIS — E559 Vitamin D deficiency, unspecified: Secondary | ICD-10-CM | POA: Diagnosis present

## 2023-07-27 DIAGNOSIS — F064 Anxiety disorder due to known physiological condition: Secondary | ICD-10-CM | POA: Diagnosis present

## 2023-07-27 DIAGNOSIS — F411 Generalized anxiety disorder: Secondary | ICD-10-CM | POA: Diagnosis present

## 2023-07-27 DIAGNOSIS — Z5986 Financial insecurity: Secondary | ICD-10-CM | POA: Diagnosis not present

## 2023-07-27 DIAGNOSIS — Z5982 Transportation insecurity: Secondary | ICD-10-CM | POA: Diagnosis not present

## 2023-07-27 DIAGNOSIS — Z803 Family history of malignant neoplasm of breast: Secondary | ICD-10-CM | POA: Diagnosis not present

## 2023-07-27 MED ORDER — HYDROXYZINE HCL 25 MG PO TABS
25.0000 mg | ORAL_TABLET | Freq: Four times a day (QID) | ORAL | Status: AC | PRN
  Administered 2023-07-27: 25 mg via ORAL

## 2023-07-27 MED ORDER — ACETAMINOPHEN 325 MG PO TABS: 650.0000 mg | ORAL_TABLET | Freq: Four times a day (QID) | ORAL | Status: DC | PRN

## 2023-07-27 MED ORDER — ALUM & MAG HYDROXIDE-SIMETH 200-200-20 MG/5ML PO SUSP: 30.0000 mL | ORAL | Status: DC | PRN

## 2023-07-27 MED ORDER — VITAMIN B-1 100 MG PO TABS
100.0000 mg | ORAL_TABLET | Freq: Every day | ORAL | Status: DC
  Administered 2023-07-27 – 2023-07-29 (×3): 100 mg via ORAL
  Filled 2023-07-27 (×6): qty 1

## 2023-07-27 MED ORDER — MAGNESIUM HYDROXIDE 400 MG/5ML PO SUSP: 30.0000 mL | Freq: Every day | ORAL | Status: DC | PRN

## 2023-07-27 MED ORDER — LORAZEPAM 1 MG PO TABS
1.0000 mg | ORAL_TABLET | Freq: Four times a day (QID) | ORAL | Status: AC | PRN
  Administered 2023-07-27: 1 mg via ORAL
  Filled 2023-07-27: qty 1

## 2023-07-27 MED ORDER — DIPHENHYDRAMINE HCL 50 MG/ML IJ SOLN: 50.0000 mg | Freq: Three times a day (TID) | INTRAMUSCULAR | Status: DC | PRN

## 2023-07-27 MED ORDER — DIPHENHYDRAMINE HCL 25 MG PO CAPS: 50.0000 mg | ORAL_CAPSULE | Freq: Three times a day (TID) | ORAL | Status: DC | PRN

## 2023-07-27 MED ORDER — TRAZODONE HCL 50 MG PO TABS
50.0000 mg | ORAL_TABLET | Freq: Every evening | ORAL | Status: DC | PRN
  Administered 2023-07-27: 50 mg via ORAL
  Filled 2023-07-27 (×2): qty 1

## 2023-07-27 MED ORDER — LORAZEPAM 2 MG/ML IJ SOLN: 2.0000 mg | Freq: Three times a day (TID) | INTRAMUSCULAR | Status: DC | PRN

## 2023-07-27 MED ORDER — HYDROXYZINE HCL 25 MG PO TABS
25.0000 mg | ORAL_TABLET | Freq: Three times a day (TID) | ORAL | Status: DC | PRN
  Filled 2023-07-27 (×2): qty 1

## 2023-07-27 MED ORDER — LORAZEPAM 2 MG/ML IJ SOLN
2.0000 mg | Freq: Three times a day (TID) | INTRAMUSCULAR | Status: DC | PRN
Start: 2023-07-27 — End: 2023-07-29

## 2023-07-27 MED ORDER — HALOPERIDOL LACTATE 5 MG/ML IJ SOLN: 5.0000 mg | Freq: Three times a day (TID) | INTRAMUSCULAR | Status: DC | PRN

## 2023-07-27 MED ORDER — ADULT MULTIVITAMIN W/MINERALS CH
1.0000 | ORAL_TABLET | Freq: Every day | ORAL | Status: DC
  Administered 2023-07-27 – 2023-07-29 (×3): 1 via ORAL
  Filled 2023-07-27 (×6): qty 1

## 2023-07-27 MED ORDER — HALOPERIDOL 5 MG PO TABS: 5.0000 mg | ORAL_TABLET | Freq: Three times a day (TID) | ORAL | Status: DC | PRN

## 2023-07-27 MED ORDER — HALOPERIDOL LACTATE 5 MG/ML IJ SOLN: 10.0000 mg | Freq: Three times a day (TID) | INTRAMUSCULAR | Status: DC | PRN

## 2023-07-27 MED ORDER — LOPERAMIDE HCL 2 MG PO CAPS: 2.0000 mg | ORAL_CAPSULE | ORAL | Status: AC | PRN

## 2023-07-27 MED ORDER — ONDANSETRON 4 MG PO TBDP: 4.0000 mg | ORAL_TABLET | Freq: Four times a day (QID) | ORAL | Status: AC | PRN

## 2023-07-27 NOTE — Plan of Care (Signed)
   Problem: Education: Goal: Emotional status will improve Outcome: Not Progressing Goal: Mental status will improve Outcome: Not Progressing
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
## 2023-07-27 NOTE — Progress Notes (Signed)
   07/27/23 0612  15 Minute Checks  Location Bedroom  Visual Appearance Calm  Behavior Sleeping  Sleep (Behavioral Health Patients Only)  Calculate sleep? (Click Yes once per 24 hr at 0600 safety check) Yes  Documented sleep last 24 hours 2.75

## 2023-07-27 NOTE — Tx Team (Signed)
 Initial Treatment Plan 07/27/2023 2:26 AM JASN XIA [id number]    PATIENT STRESSORS: Financial difficulties   Legal issue   Occupational concerns   Other: homeless     PATIENT STRENGTHS: Ability for insight  Printmaker for treatment/growth    PATIENT IDENTIFIED PROBLEMS: Suicidal  Homicidal  Anxiety  Depression      "Help with therapy"  "Help with not having depression"       DISCHARGE CRITERIA:  Improved stabilization in mood, thinking, and/or behavior Motivation to continue treatment in a less acute level of care Reduction of life-threatening or endangering symptoms to within safe limits  PRELIMINARY DISCHARGE PLAN: Outpatient therapy  PATIENT/FAMILY INVOLVEMENT: This treatment plan has been presented to and reviewed with the patient, Ronnie Rivera.  The patient and family have been given the opportunity to ask questions and make suggestions.  Marcie Bal, RN 07/27/2023, 2:26 AM

## 2023-07-27 NOTE — Plan of Care (Signed)
 Pt presents with animated expression and anxious affect. Reports good sleep and appetite. Rates anxiety and depression 3/10. Denies SI, HI, AVH, and pain. Pt is cooperative and pleasant in interactions with staff. Pt observed attending afternoon group in the dayroom. Medication compliant with no adverse reactions. Safety checks maintained at q 15 minutes. Support, encouragement, and reassurance offered to the pt.   Problem: Education: Goal: Emotional status will improve Outcome: Progressing Goal: Mental status will improve Outcome: Progressing   Problem: Activity: Goal: Interest or engagement in activities will improve Outcome: Progressing   Problem: Safety: Goal: Periods of time without injury will increase Outcome: Progressing

## 2023-07-27 NOTE — [facility] Suicide Risk Assessment (Signed)
 [facility] INPATIENT:  Family/Significant Other Suicide Prevention Education  Suicide Prevention Education:  Education Completed; Willette Brace; Girlfriend; [phone], has been identified by the patient as the family member/significant other with whom the patient will be residing, and identified as the person(s) who will aid the patient in the event of a mental health crisis (suicidal ideations/suicide attempt).  With written consent from the patient, the family member/significant other has been provided the following suicide prevention education, prior to the and/or following the discharge of the patient.  The suicide prevention education provided includes the following: Suicide risk factors Suicide prevention and interventions National Suicide Hotline telephone number [facility] assessment telephone number [facility] Emergency Assistance 911 [facility] and/or Residential Mobile Crisis Unit telephone number  Request made of family/significant other to: Remove weapons (e.g., guns, rifles, knives), all items previously/currently identified as safety concern.   Remove drugs/medications (over-the-counter, prescriptions, illicit drugs), all items previously/currently identified as a safety concern.  The family member/significant other verbalizes understanding of the suicide prevention education information provided.  The family member/significant other agrees to remove the items of safety concern listed above.  Lowry Ram 07/27/2023, 4:17 PM

## 2023-07-27 NOTE — Group Note (Signed)
 Date:  07/27/2023 Time:  11:59 AM  Group Topic/Focus:  Goals Group:   The focus of this group is to help patients establish daily goals to achieve during treatment and discuss how the patient can incorporate goal setting into their daily lives to aide in recovery.    Participation Level:  Active  Participation Quality:  Attentive  Affect:  Appropriate  Cognitive:  Appropriate  Insight: Good  Engagement in Group:  Engaged  Modes of Intervention:  Education  Additional Comments:    Estill Dooms 07/27/2023, 11:59 AM
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
## 2023-07-27 NOTE — Group Note (Unsigned)
 Date:  07/27/2023 Time:  10:29 AM  Group Topic/Focus:  Goals Group:   The focus of this group is to help patients establish daily goals to achieve during treatment and discuss how the patient can incorporate goal setting into their daily lives to aide in recovery.     Participation Level:  {[facility] PARTICIPATION ZOXWR:60454}  Participation Quality:  {[facility] PARTICIPATION QUALITY:22265}  Affect:  {[facility] AFFECT:22266}  Cognitive:  {[facility] COGNITIVE:22267}  Insight: {B[facility] Insight2:20797}  Engagement in Group:  {[facility] ENGAGEMENT IN UJWJX:91478}  Modes of Intervention:  {[facility] MODES OF INTERVENTION:22269}  Additional Comments:  ***  Estill Dooms 07/27/2023, 10:29 AM
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
## 2023-07-27 NOTE — Group Note (Signed)
 Date:  07/27/2023 Time:  2:31 PM  Group Topic/Focus:  Dimensions of Wellness:   The focus of this group is to introduce the topic of wellness and discuss the role each dimension of wellness plays in total health.    Participation Level:  Active  Participation Quality:  Appropriate  Affect:  Appropriate  Cognitive:  Alert  Insight: Appropriate  Engagement in Group:  Engaged  Modes of Intervention:  Activity   Katonya Blecher L Terea Neubauer 07/27/2023, 2:31 PM
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
## 2023-07-28 ENCOUNTER — Encounter (HOSPITAL_COMMUNITY): Payer: Self-pay

## 2023-07-28 DIAGNOSIS — Z765 Malingerer [conscious simulation]: Secondary | ICD-10-CM

## 2023-07-28 DIAGNOSIS — F4325 Adjustment disorder with mixed disturbance of emotions and conduct: Secondary | ICD-10-CM | POA: Diagnosis not present

## 2023-07-28 LAB — FOLATE: Folate: 6.3 ng/mL (ref >5.9)

## 2023-07-28 LAB — VITAMIN D 25 HYDROXY (VIT D DEFICIENCY, FRACTURES): Vit D, 25-Hydroxy: 7.77 ng/mL — ABNORMAL LOW (ref 30–100)

## 2023-07-28 LAB — VITAMIN B12: Vitamin B-12: 189 pg/mL (ref 180–914)

## 2023-07-28 NOTE — Progress Notes (Signed)
   07/28/23 0543  15 Minute Checks  Location Bedroom  Visual Appearance Calm  Behavior Sleeping  Sleep (Behavioral Health Patients Only)  Calculate sleep? (Click Yes once per 24 hr at 0600 safety check) Yes  Documented sleep last 24 hours 8.25

## 2023-07-28 NOTE — [facility] Group Notes (Signed)
 Group Focus: This Spirituality Group focused on the topic of grief and loss. The framework of grief theory broadly followed is that of Worden's Four Tasks of Grief. Group dynamics follow theoretical principles outlined by Chyrl Civatte, and also informed by Relational Cultural Theory and Lynnell Jude.  Observations: Ronnie Rivera was present for the full group time. He was an active participant and made numerous contributions that furthered the conversation.  [name], M.Div [phone]

## 2023-07-28 NOTE — Group Note (Signed)
 Recreation Therapy Group Note   Group Topic:Communication  Group Date: 07/28/2023 Start Time: 0934 End Time: 1002 Facilitators: Sameera Betton-McCall, LRT,CTRS Location: [address] Day[address]   Group Topic: Communication, Problem Solving   Goal Area(s) Addresses:  Patient will effectively listen to complete activity.  Patient will identify communication skills used to make activity successful.  Patient will identify how skills used during activity can be used to reach post d/c goals.    Intervention: Building surveyor Activity - Geometric pattern cards, pencils, blank paper    Activity: Geometric Drawings.  Three volunteers from the peer group will be shown an abstract picture with a particular arrangement of geometrical shapes.  Each round, one 'speaker' will describe the pattern, as accurately as possible without revealing the image to the group.  The remaining group members will listen and draw the picture to reflect how it is described to them. Patients with the role of 'listener' cannot ask clarifying questions but, may request that the speaker repeat a direction. Once the drawings are complete, the presenter will show the rest of the group the picture and compare how close each person came to drawing the picture. LRT will facilitate a post-activity discussion regarding effective communication and the importance of planning, listening, and asking for clarification in daily interactions with others.   Education: Environmental consultant, Active listening, Support systems, Discharge planning  Education Outcome: Acknowledges understanding/In group clarification offered/Needs additional education.    Affect/Mood: N/A   Participation Level: Did not attend    Clinical Observations/Individualized Feedback:     Plan: Continue to engage patient in RT group sessions 2-3x/week.   Joyceline Maiorino-McCall, LRT,CTRS 07/28/2023 12:02 PM

## 2023-07-28 NOTE — Plan of Care (Signed)
   Problem: Activity: Goal: Interest or engagement in activities will improve Outcome: Progressing   Problem: Coping: Goal: Ability to verbalize frustrations and anger appropriately will improve Outcome: Progressing

## 2023-07-28 NOTE — Plan of Care (Signed)
  Problem: Education: Goal: Emotional status will improve Outcome: Progressing   Problem: Coping: Goal: Ability to verbalize frustrations and anger appropriately will improve Outcome: Progressing   Problem: Coping: Goal: Ability to demonstrate self-control will improve Outcome: Progressing

## 2023-07-28 NOTE — Group Note (Signed)
 Date:  07/28/2023 Time:  9:32 PM  Group Topic/Focus:  Alcoholics Anonymous (AA) Meeting    Participation Level:  Active  Participation Quality:  Appropriate  Affect:  Appropriate  Cognitive:  Appropriate  Insight: Appropriate  Engagement in Group:  Engaged  Modes of Intervention:  Discussion and Support  Additional Comments:  Patient attended AA meeting.   Kennieth Francois 07/28/2023, 9:32 PM
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
## 2023-07-28 NOTE — Group Note (Signed)
 Occupational Therapy Group Note  Group Topic:Coping Skills  Group Date: 07/28/2023 Start Time: 1430 End Time: 1500 Facilitators: Ted Mcalpine, OT   Group Description: Group encouraged increased engagement and participation through discussion and activity focused on "Coping Ahead." Patients were split up into teams and selected a card from a stack of positive coping strategies. Patients were instructed to act out/charade the coping skill for other peers to guess and receive points for their team. Discussion followed with a focus on identifying additional positive coping strategies and patients shared how they were going to cope ahead over the weekend while continuing hospitalization stay.  Therapeutic Goal(s): Identify positive vs negative coping strategies. Identify coping skills to be used during hospitalization vs coping skills outside of hospital/at home Increase participation in therapeutic group environment and promote engagement in treatment   Participation Level: Engaged   Participation Quality: Independent   Behavior: Appropriate   Speech/Thought Process: Loose association    Affect/Mood: Appropriate   Insight: Fair   Judgement: Fair      Modes of Intervention: Education  Patient Response to Interventions:  Attentive   Plan: Continue to engage patient in OT groups 2 - 3x/week.  07/28/2023  Ted Mcalpine, OT  Kerrin Champagne, OT

## 2023-07-28 NOTE — Progress Notes (Signed)
 The patient is calm and cooperative, observed in the group room, and denies SI, HI, or AVH   07/28/23 2100  Psych Admission Type (Psych Patients Only)  Admission Status Voluntary  Psychosocial Assessment  Patient Complaints Depression  Eye Contact Fair  Facial Expression Other (Comment) (wnl)  Affect Appropriate to circumstance  Speech Logical/coherent  Interaction Assertive  Motor Activity Other (Comment)  Appearance/Hygiene Unremarkable  Behavior Characteristics Cooperative;Appropriate to situation  Mood Euthymic;Pleasant  Thought Process  Coherency WDL  Content WDL  Delusions None reported or observed  Perception WDL  Hallucination None reported or observed  Judgment UTA  Confusion None  Danger to Self  Current suicidal ideation? Denies  Agreement Not to Harm Self Yes  Description of Agreement Verbal  Danger to Others  Danger to Others None reported or observed  Danger to Others Abnormal  Harmful Behavior to others No threats or harm toward other people

## 2023-07-28 NOTE — BH IP Treatment Plan (Signed)
 Interdisciplinary Treatment and Diagnostic Plan Update  07/28/2023 Time of Session: 1115 P[address] CLAYTOR MRN: [id number]  Principal Diagnosis: Adjustment disorder with mixed disturbance of emotions and conduct  Secondary Diagnoses: Principal Problem:   Adjustment disorder with mixed disturbance of emotions and conduct Active Problems:   Malingering   Current Medications:  Current Facility-Administered Medications  Medication Dose Route Frequency Provider Last Rate Last Admin   acetaminophen (TYLENOL) tablet 650 mg  650 mg Oral Q6H PRN Ardis Hughs, NP       alum & mag hydroxide-simeth (MAALOX/MYLANTA) 200-200-20 MG/5ML suspension 30 mL  30 mL Oral Q4H PRN Ardis Hughs, NP       haloperidol (HALDOL) tablet 5 mg  5 mg Oral TID PRN Ardis Hughs, NP       And   diphenhydrAMINE (BENADRYL) capsule 50 mg  50 mg Oral TID PRN Ardis Hughs, NP       haloperidol lactate (HALDOL) injection 5 mg  5 mg Intramuscular TID PRN Ardis Hughs, NP       And   diphenhydrAMINE (BENADRYL) injection 50 mg  50 mg Intramuscular TID PRN Ardis Hughs, NP       And   LORazepam (ATIVAN) injection 2 mg  2 mg Intramuscular TID PRN Ardis Hughs, NP       haloperidol lactate (HALDOL) injection 10 mg  10 mg Intramuscular TID PRN Ardis Hughs, NP       And   diphenhydrAMINE (BENADRYL) injection 50 mg  50 mg Intramuscular TID PRN Ardis Hughs, NP       And   LORazepam (ATIVAN) injection 2 mg  2 mg Intramuscular TID PRN Ardis Hughs, NP       hydrOXYzine (ATARAX) tablet 25 mg  25 mg Oral TID PRN Ardis Hughs, NP       hydrOXYzine (ATARAX) tablet 25 mg  25 mg Oral Q6H PRN Ardis Hughs, NP   25 mg at 07/27/23 2124   loperamide (IMODIUM) capsule 2-4 mg  2-4 mg Oral PRN Ardis Hughs, NP       LORazepam (ATIVAN) tablet 1 mg  1 mg Oral Q6H PRN Ardis Hughs, NP   1 mg at 07/27/23 2010   magnesium hydroxide (MILK OF MAGNESIA) suspension 30 mL   30 mL Oral Daily PRN Ardis Hughs, NP       multivitamin with minerals tablet 1 tablet  1 tablet Oral Daily Ardis Hughs, NP   1 tablet at 07/28/23 0812   ondansetron (ZOFRAN-ODT) disintegrating tablet 4 mg  4 mg Oral Q6H PRN Ardis Hughs, NP       thiamine (Vitamin B-1) tablet 100 mg  100 mg Oral Daily Vernard Gambles H, NP   100 mg at 07/28/23 3244   traZODone (DESYREL) tablet 50 mg  50 mg Oral QHS PRN Ardis Hughs, NP   50 mg at 07/27/23 2124   PTA Medications: No medications prior to admission.    Patient Stressors: Neurosurgeon issue   Occupational concerns   Other: homeless    Patient Strengths: Ability for Contractor for treatment/growth   Treatment Modalities: Medication Management, Group therapy, Case management,  1 to 1 session with clinician, Psychoeducation, Recreational therapy.   Physician Treatment Plan for Primary Diagnosis: Adjustment disorder with mixed disturbance of emotions and conduct Long Term Goal(s):     Short Term Goals:  Medication Management: Evaluate patient's response, side effects, and tolerance of medication regimen.  Therapeutic Interventions: 1 to 1 sessions, Unit Group sessions and Medication administration.  Evaluation of Outcomes: Not Progressing  Physician Treatment Plan for Secondary Diagnosis: Principal Problem:   Adjustment disorder with mixed disturbance of emotions and conduct Active Problems:   Malingering  Long Term Goal(s):     Short Term Goals:       Medication Management: Evaluate patient's response, side effects, and tolerance of medication regimen.  Therapeutic Interventions: 1 to 1 sessions, Unit Group sessions and Medication administration.  Evaluation of Outcomes: Not Progressing   RN Treatment Plan for Primary Diagnosis: Adjustment disorder with mixed disturbance of emotions and conduct Long Term Goal(s): Knowledge of disease and  therapeutic regimen to maintain health will improve  Short Term Goals: Ability to remain free from injury will improve, Ability to verbalize feelings will improve, Ability to identify and develop effective coping behaviors will improve, and Compliance with prescribed medications will improve  Medication Management: RN will administer medications as ordered by provider, will assess and evaluate patient's response and provide education to patient for prescribed medication. RN will report any adverse and/or side effects to prescribing provider.  Therapeutic Interventions: 1 on 1 counseling sessions, Psychoeducation, Medication administration, Evaluate responses to treatment, Monitor vital signs and CBGs as ordered, Perform/monitor CIWA, COWS, AIMS and Fall Risk screenings as ordered, Perform wound care treatments as ordered.  Evaluation of Outcomes: Not Progressing   LCSW Treatment Plan for Primary Diagnosis: Adjustment disorder with mixed disturbance of emotions and conduct Long Term Goal(s): Safe transition to appropriate next level of care at discharge, Engage patient in therapeutic group addressing interpersonal concerns.  Short Term Goals: Engage patient in aftercare planning with referrals and resources, Increase ability to appropriately verbalize feelings, Facilitate acceptance of mental health diagnosis and concerns, Identify triggers associated with mental health/substance abuse issues, and Increase skills for wellness and recovery  Therapeutic Interventions: Assess for all discharge needs, 1 to 1 time with Social worker, Explore available resources and support systems, Assess for adequacy in community support network, Educate family and significant other(s) on suicide prevention, Complete Psychosocial Assessment, Interpersonal group therapy.  Evaluation of Outcomes: Not Progressing   Progress in Treatment: Attending groups: Yes. Participating in groups: Yes. Taking medication as  prescribed: Yes. Toleration medication: Yes. Family/Significant other contact made: Yes, individual(s) contacted:  CSW spoke with [name] Patient understands diagnosis: Yes. Discussing patient identified problems/goals with staff: Yes. Medical problems stabilized or resolved: Yes. Denies suicidal/homicidal ideation: Yes. Issues/concerns per patient self-inventory: No. Other: n/a  New problem(s) identified: No, Describe:  n/a  New Short Term/Long Term Goal(s): medication stabilization, elimination of SI thoughts, development of comprehensive mental wellness plan.   Patient Goals:  "I'm ready to go now honestly"  Discharge Plan or Barriers:   Reason for Continuation of Hospitalization: Medication stabilization Other; describe mood stabilization, discharge planning   Estimated Length of Stay:  Last 3 [place] Suicide Severity Risk Score: Flowsheet Row [facility] (Current[facility] from 07/25/2023 in [facility] Counselor from 07/23/2023 in [facility]  C-SSRS RISK CATEGORY High Risk High Risk No Risk       Last [facility] 2/9 Scores:    07/23/2023    1:15 PM 07/22/2023   11:43 AM 05/01/2022   11:27 AM  Depression screen PHQ 2/9  Decreased Interest 0 0 1  Down, Depressed, Hopeless 0 0 1  PHQ - 2 Score 0 0 2  Altered sleeping 0 0 0  Tired, decreased energy 0 0 0  Change in appetite 0 0 0  Feeling bad or failure about yourself  1 0 0  Trouble concentrating 0 0 1  Moving slowly or fidgety/restless 0 0 1  Suicidal thoughts 0 0 0  PHQ-9 Score 1 0 4  Difficult doing work/chores Somewhat difficult Not difficult at all Somewhat difficult    Scribe for Treatment Team: Jacinta Shoe, LCSW 07/28/2023 2:24 PM

## 2023-07-28 NOTE — Progress Notes (Signed)
   07/28/23 0900  Psych Admission Type (Psych Patients Only)  Admission Status Voluntary  Psychosocial Assessment  Patient Complaints Anxiety;Depression  Eye Contact Fair  Facial Expression Anxious  Affect Anxious  Speech Logical/coherent  Interaction Assertive  Motor Activity Other (Comment) (WDL)  Appearance/Hygiene Unremarkable  Behavior Characteristics Cooperative;Appropriate to situation  Mood Euthymic;Pleasant  Thought Process  Coherency WDL  Content WDL  Delusions None reported or observed  Perception WDL  Hallucination None reported or observed  Judgment Poor  Confusion None  Danger to Self  Current suicidal ideation? Denies  Agreement Not to Harm Self Yes  Description of Agreement Verbal  Danger to Others  Danger to Others None reported or observed  Danger to Others Abnormal  Harmful Behavior to others No threats or harm toward other people

## 2023-07-28 NOTE — Progress Notes (Signed)
 [facility] MD Progress Note  07/28/2023 12:35 PM Ronnie Rivera  MRN:  [id number] Principal Problem: Adjustment disorder with mixed disturbance of emotions and conduct Diagnosis: Principal Problem:   Adjustment disorder with mixed disturbance of emotions and conduct Active Problems:   Malingering   ID & Admission Information: Ronnie Rivera is an 38 y.o. male who  has a past medical history of Femur fracture (HCC) (09/19/2011), GAD (generalized anxiety disorder) (04/15/2022), Mild depression (04/15/2022), Painful orthopaedic hardware (HCC) (05/03/2014), Patellofemoral disorder of right knee (07/22/2019), PTSD (post-traumatic stress disorder) (04/15/2022), and Scalp laceration (11/16/2019).  He presented on 07/27/2023 12:52 AM for Adjustment disorder with mixed disturbance of emotions and conduct.  He reported suicidal ideation.  He stated that he help to get housing and money by coming to the hospital.  Chief Complaint: "I was having bad thoughts to hurt myself"  Subjective:   Case was discussed in the multidisciplinary team. MAR was reviewed and patient was compliant with medications.  He was reportedly irritable and argumentative and got into a verbal altercation with another patient.  PRN's in last 24 hours: He received Ativan at 2010 last night, and Atarax 25 mg at 2124 last night.  Psychiatric Team made the following recommendations yesterday: Patient refused medications.  Dr. Alfonse Flavors tells me that she believes the patient is malingering.  On my exam today, the patient denies psychiatric complaints or concerns.  He does report that he has had difficulty holding jobs, and does seem to have a problem with anger.  I suggested starting a mood stabilizer, but the patient refused.  He denies suicidal ideation, homicidal ideation, auditory hallucinations, or visual hallucinations.  He continues to have unrealistic expectations about services the hospitalist can provide.  Psychoeducation was provided  and he expressed understanding.  Will continue to monitor the patient today with a plan to discharge tomorrow.   Past Psychiatric and Medical Medical History: See H&P Past Medical History:  Diagnosis Date   Femur fracture (HCC) 09/19/2011   GAD (generalized anxiety disorder) 04/15/2022   Mild depression 04/15/2022   Painful orthopaedic hardware (HCC) 05/03/2014   Patellofemoral disorder of right knee 07/22/2019   PTSD (post-traumatic stress disorder) 04/15/2022   Scalp laceration 11/16/2019    History reviewed. No pertinent surgical history.  Family History(Medical and Psychiatric): History reviewed. No pertinent family history. See H&P    Social History: See H&P Social History   Substance and Sexual Activity  Alcohol Use Yes   Alcohol/week: 1.0 standard drink of alcohol   Types: 1 Glasses of wine per week   Comment: occassional     Social History   Substance and Sexual Activity  Drug Use Yes   Types: Marijuana   Comment: uses edibles occasionally.    Social History   Socioeconomic History   Marital status: Single    Spouse name: Not on file   Number of children: Not on file   Years of education: Not on file   Highest education level: Not on file  Occupational History   Not on file  Tobacco Use   Smoking status: Never   Smokeless tobacco: Never  Vaping Use   Vaping status: Never Used  Substance and Sexual Activity   Alcohol use: Yes    Alcohol/week: 1.0 standard drink of alcohol    Types: 1 Glasses of wine per week    Comment: occassional   Drug use: Yes    Types: Marijuana    Comment: uses edibles occasionally.   Sexual activity: Yes  Other Topics Concern   Not on file  Social History Narrative   Not on file   Social Drivers of Health   Financial Resource Strain: High Risk (04/15/2022)   Overall Financial Resource Strain (CARDIA)    Difficulty of Paying Living Expenses: Hard  Food Insecurity: Food Insecurity Present (07/27/2023)   Hunger Vital  Sign    Worried About Running Out of Food in the Last Year: Sometimes true    Ran Out of Food in the Last Year: Sometimes true  Transportation Needs: Unmet Transportation Needs (07/27/2023)   PRAPARE - Administrator, Civil Service (Medical): Yes    Lack of Transportation (Non-Medical): Yes  Physical Activity: Sufficiently Active (04/15/2022)   Exercise Vital Sign    Days of Exercise per Week: 7 days    Minutes of Exercise per Session: 60 min  Stress: Stress Concern Present (04/15/2022)   [employer] of Occupational Health - Occupational Stress Questionnaire    Feeling of Stress : Very much  Social Connections: Socially Isolated (04/15/2022)   Social Connection and Isolation Panel [NHANES]    Frequency of Communication with Friends and Family: More than three times a week    Frequency of Social Gatherings with Friends and Family: Three times a week    Attends Religious Services: Never    Active Member of Clubs or Organizations: No    Attends Banker Meetings: Never    Marital Status: Never married        Current Medications: Current Facility-Administered Medications  Medication Dose Route Frequency Provider Last Rate Last Admin   acetaminophen (TYLENOL) tablet 650 mg  650 mg Oral Q6H PRN Ardis Hughs, NP       alum & mag hydroxide-simeth (MAALOX/MYLANTA) 200-200-20 MG/5ML suspension 30 mL  30 mL Oral Q4H PRN Ardis Hughs, NP       haloperidol (HALDOL) tablet 5 mg  5 mg Oral TID PRN Ardis Hughs, NP       And   diphenhydrAMINE (BENADRYL) capsule 50 mg  50 mg Oral TID PRN Ardis Hughs, NP       haloperidol lactate (HALDOL) injection 5 mg  5 mg Intramuscular TID PRN Ardis Hughs, NP       And   diphenhydrAMINE (BENADRYL) injection 50 mg  50 mg Intramuscular TID PRN Ardis Hughs, NP       And   LORazepam (ATIVAN) injection 2 mg  2 mg Intramuscular TID PRN Ardis Hughs, NP       haloperidol lactate (HALDOL)  injection 10 mg  10 mg Intramuscular TID PRN Ardis Hughs, NP       And   diphenhydrAMINE (BENADRYL) injection 50 mg  50 mg Intramuscular TID PRN Ardis Hughs, NP       And   LORazepam (ATIVAN) injection 2 mg  2 mg Intramuscular TID PRN Ardis Hughs, NP       hydrOXYzine (ATARAX) tablet 25 mg  25 mg Oral TID PRN Ardis Hughs, NP       hydrOXYzine (ATARAX) tablet 25 mg  25 mg Oral Q6H PRN Ardis Hughs, NP   25 mg at 07/27/23 2124   loperamide (IMODIUM) capsule 2-4 mg  2-4 mg Oral PRN Ardis Hughs, NP       LORazepam (ATIVAN) tablet 1 mg  1 mg Oral Q6H PRN Ardis Hughs, NP   1 mg at 07/27/23 2010   magnesium hydroxide (MILK  OF MAGNESIA) suspension 30 mL  30 mL Oral Daily PRN Ardis Hughs, NP       multivitamin with minerals tablet 1 tablet  1 tablet Oral Daily Ardis Hughs, NP   1 tablet at 07/28/23 5638   ondansetron (ZOFRAN-ODT) disintegrating tablet 4 mg  4 mg Oral Q6H PRN Ardis Hughs, NP       thiamine (Vitamin B-1) tablet 100 mg  100 mg Oral Daily Vernard Gambles H, NP   100 mg at 07/28/23 7564   traZODone (DESYREL) tablet 50 mg  50 mg Oral QHS PRN Ardis Hughs, NP   50 mg at 07/27/23 2124    Lab Results:  No results found for this or any previous visit (from the past 48 hours).  Blood Alcohol level:  Lab Results  Component Value Date   ETH <10 07/25/2023   ETH <10 11/16/2019    Metabolic Disorder Labs: Lab Results  Component Value Date   HGBA1C 4.4 (L) 07/25/2023   MPG 79.58 07/25/2023   No results found for: "PROLACTIN" Lab Results  Component Value Date   CHOL 159 07/25/2023   TRIG 76 07/25/2023   HDL 48 07/25/2023   CHOLHDL 3.3 07/25/2023   VLDL 15 07/25/2023   LDLCALC 96 07/25/2023   LDLCALC 66 09/29/2009    Physical Findings: AIMS:  , ,  ,  ,    CIWA:  CIWA-Ar Total: 0 COWS:     Psychiatric Specialty Exam:  Presentation  General Appearance: Appropriate for Environment  Eye Contact:  Good  Speech: Clear and Coherent; Normal Rate  Speech Volume: Normal  Handedness: Right   Mood and Affect  Mood: Euthymic  Affect: Congruent   Thought Process  Thought Processes: Linear  Descriptions of Associations: Intact  Orientation: Full (Time, Place and Person)  Thought Content: Logical  History of Schizophrenia/Schizoaffective disorder: No  Duration of Psychotic Symptoms: NA Hallucinations: Hallucinations: None  Ideas of Reference: None  Suicidal Thoughts: Suicidal Thoughts: No  Homicidal Thoughts: Homicidal Thoughts: No   Sensorium  Memory: Immediate Good  Judgment: Poor  Insight: Fair   Art therapist  Concentration: Good  Attention Span: Good  Recall: Good  Fund of Knowledge: Good  Language: Good   Psychomotor Activity  Psychomotor Activity: Psychomotor Activity: Normal   Assets  Assets: Communication Skills; Leisure Time; Resilience   Sleep  Sleep: Sleep: Good   Musculoskeletal: Strength & Muscle Tone: within normal limits Gait & Station: normal Patient leans: N/A   Physical Exam: General: Sitting comfortably. NAD. HEENT: Normocephalic, atraumatic, MMM, EMOI Lungs: no increased work of breathing noted Heart: no cyanosis Abdomen: Non distended Musculoskeletal: FROM. No obvious deformities Skin: Warm, dry, intact. No rashes noted Neuro: No obvious focal deficits.  Gait and station are normal  Review of Systems  Constitutional: Negative.   HENT: Negative.    Eyes: Negative.   Respiratory: Negative.    Cardiovascular: Negative.   Gastrointestinal: Negative.   Genitourinary: Negative.   Skin: Negative.   Neurological: Negative.   Psychiatric/Behavioral: Negative   Blood pressure 131/79, pulse 83, temperature 97.7 F (36.5 C), temperature source Oral, resp. rate 16, height 5\' 9"  (1.753 m), weight 93 kg, SpO2 100%. Body mass index is 30.27 kg/m.  ASSESSMENT: Ronnie Rivera is an 38 y.o. male who  has a  past medical history of Femur fracture (HCC) (09/19/2011), GAD (generalized anxiety disorder) (04/15/2022), Mild depression (04/15/2022), Painful orthopaedic hardware (HCC) (05/03/2014), Patellofemoral disorder of right knee (07/22/2019), PTSD (post-traumatic stress  disorder) (04/15/2022), and Scalp laceration (11/16/2019).  He presented on 07/27/2023 12:52 AM for Adjustment disorder with mixed disturbance of emotions and conduct.  Presentation is concerning for secondary gain.  Diagnoses / Active Problems: Patient Active Problem List   Diagnosis Date Noted   Malingering 07/28/2023   Adjustment disorder with mixed disturbance of emotions and conduct 07/26/2023   Adjustment disorder with anxious mood 07/22/2023      PLAN: Safety and Monitoring:  -- Voluntary admission to inpatient psychiatric unit for safety, stabilization and treatment  -- Daily contact with patient to assess and evaluate symptoms and progress in treatment  -- Patient's case to be discussed in multi-disciplinary team meeting  -- Observation Level : q15 minute checks  -- Vital signs:  q12 hours  -- Precautions: suicide, elopement, and assault  2. Psychiatric Diagnoses and Treatment:  Patient Active Problem List   Diagnosis Date Noted   Malingering 07/28/2023   Adjustment disorder with mixed disturbance of emotions and conduct 07/26/2023   Adjustment disorder with anxious mood 07/22/2023     Scheduled Medications:  multivitamin with minerals  1 tablet Oral Daily   thiamine  100 mg Oral Daily    As Needed Medications: acetaminophen, alum & mag hydroxide-simeth, haloperidol **AND** diphenhydrAMINE, haloperidol lactate **AND** diphenhydrAMINE **AND** LORazepam, haloperidol lactate **AND** diphenhydrAMINE **AND** LORazepam, hydrOXYzine, hydrOXYzine, loperamide, LORazepam, magnesium hydroxide, ondansetron, traZODone    3. Medical Issues Being Addressed:   -- None  Labs reviewed, unremarkable.    4. Discharge  Planning:   -- Social work and case management to assist with discharge planning and identification of hospital follow-up needs prior to discharge  -- Estimated LOS: Discharge 07/29/2023  -- Discharge Concerns: Need to establish a safety plan; Medication compliance and effectiveness  -- Discharge Goals: Return home with outpatient referrals for mental health follow-up including medication management/psychotherapy  5. Short Term Goals:  Improve ability to identify changes in lifestyle to reduce recurrence of condition, verbalize feelings, disclose and discuss suicidal ideas, demonstrate self-control, identify and develop effective coping behaviors, compliance with prescribed medications, identify triggers associated with substance abuse/mental health issues, participate in unit milieu and in scheduled group therapies   6. Long Term Goals: Improvement in symptoms so the patient is ready for discharge   --The risks/benefits/side-effects/alternatives to the medications above were discussed in detail with the patient and time was given for questions. The patient provided informed consent.       Total Time Spent in Direct Patient Care:  I personally spent 35 minutes on the unit in direct patient care. The direct patient care time included face-to-face time with the patient, reviewing the patient's chart, communicating with other professionals, and coordinating care. Greater than 50% of this time was spent in counseling or coordinating care with the patient regarding goals of hospitalization, psycho-education, and discharge planning needs.      Criss Alvine, MD Psychiatrist  07/28/2023, 12:35 PM   I certify that inpatient services furnished can reasonably be expected to improve the patient's condition.    Portions of this note were created using voice recognition software. Minor syntax errors, grammatical content, spelling, or punctuation errors may have occurred unintentionally. Please notify  the There[facility] Parkin if the meaning of any statement is unclear.

## 2023-07-28 NOTE — Group Note (Signed)
 Date:  07/28/2023 Time:  1:03 PM  Group Topic/Focus:  Goals Group:   The focus of this group is to help patients establish daily goals to achieve during treatment and discuss how the patient can incorporate goal setting into their daily lives to aide in recovery. Orientation:   The focus of this group is to educate the patient on the purpose and policies of crisis stabilization and provide a format to answer questions about their admission.  The group details unit policies and expectations of patients while admitted.    Participation Level:  Did Not Attend  Participation Quality:   n/a  Affect:   n/a  Cognitive:   n/a  Insight: None  Engagement in Group:   n/a  Modes of Intervention:   n/a  Additional Comments:   Did not attend.  Edmund Hilda Jerricka Carvey 07/28/2023, 1:03 PM

## 2023-07-28 NOTE — Group Note (Signed)
 Date:  07/28/2023 Time:  6:23 PM  Group Topic/Focus:  Dimensions of Wellness:   The focus of this group is to introduce the topic of wellness and discuss the role each dimension of wellness plays in total health. Overcoming Stress:   The focus of this group is to define stress and help patients assess their triggers.    Participation Level:  Active  Participation Quality:  Appropriate  Affect:  Appropriate  Cognitive:  Appropriate  Insight: Appropriate  Engagement in Group:  Engaged  Modes of Intervention:  Activity  Additional Comments:   Pt practiced progressive muscle relaxation for stress, pain and anxiety management.  [name] 07/28/2023, 6:23 PM

## 2023-07-28 NOTE — Group Note (Signed)
 Date:  07/28/2023 Time:  4:28 PM  Group Topic/Focus:  Emotional Education:   The focus of this group is to discuss what feelings/emotions are, and how they are experienced. Managing Feelings:   The focus of this group is to identify what feelings patients have difficulty handling and develop a plan to handle them in a healthier way upon discharge.    Participation Level:  Did Not Attend  Participation Quality:   n/a  Affect:   n/a  Cognitive:   n/a  Insight: None  Engagement in Group:   n/a  Modes of Intervention:   n/a  Additional Comments:   Did not attend.  Ronnie Rivera Peak 07/28/2023, 4:28 PM

## 2023-07-29 ENCOUNTER — Encounter (HOSPITAL_COMMUNITY): Payer: Self-pay | Admitting: Psychiatry

## 2023-07-29 DIAGNOSIS — E559 Vitamin D deficiency, unspecified: Secondary | ICD-10-CM

## 2023-07-29 DIAGNOSIS — F4325 Adjustment disorder with mixed disturbance of emotions and conduct: Secondary | ICD-10-CM | POA: Diagnosis not present

## 2023-07-29 HISTORY — DX: Vitamin D deficiency, unspecified: E55.9

## 2023-07-29 LAB — RPR: RPR Ser Ql: NONREACTIVE

## 2023-07-29 MED ORDER — VITAMIN D (ERGOCALCIFEROL) 1.25 MG (50000 UNIT) PO CAPS: 50000.0000 [IU] | ORAL_CAPSULE | ORAL | 0 refills | Status: AC

## 2023-07-29 MED ORDER — CHOLECALCIFEROL 125 MCG (5000 UT) PO TABS: 1.0000 | ORAL_TABLET | Freq: Every day | ORAL | 0 refills | Status: DC

## 2023-07-29 MED ORDER — VITAMIN D (ERGOCALCIFEROL) 1.25 MG (50000 UNIT) PO CAPS
50000.0000 [IU] | ORAL_CAPSULE | ORAL | Status: DC
  Administered 2023-07-29: 50000 [IU] via ORAL
  Filled 2023-07-29 (×2): qty 1

## 2023-07-29 MED ORDER — VITAMIN D3 25 MCG PO TABS
2000.0000 [IU] | ORAL_TABLET | Freq: Two times a day (BID) | ORAL | Status: DC
  Filled 2023-07-29: qty 2

## 2023-07-29 MED ORDER — THERAPEUTIC MULTIVIT/MINERAL PO TABS: 1.0000 | ORAL_TABLET | Freq: Every day | ORAL | 0 refills | Status: DC

## 2023-07-29 NOTE — Plan of Care (Signed)
   Problem: Education: Goal: Knowledge of [facility] Education information/materials will improve Outcome: Progressing   Problem: Activity: Goal: Interest or engagement in activities will improve Outcome: Progressing   Problem: Coping: Goal: Ability to verbalize frustrations and anger appropriately will improve Outcome: Progressing

## 2023-07-29 NOTE — Plan of Care (Signed)
   Problem: Education: Goal: Emotional status will improve Outcome: Progressing Goal: Mental status will improve Outcome: Progressing   Problem: Activity: Goal: Interest or engagement in activities will improve Outcome: Progressing Goal: Sleeping patterns will improve Outcome: Progressing

## 2023-07-29 NOTE — [facility] Suicide Risk Assessment (Signed)
 [facility] Discharge Suicide Risk Assessment   Principal Problem: Adjustment disorder with mixed disturbance of emotions and conduct  Discharge Diagnoses: Principal Problem:   Adjustment disorder with mixed disturbance of emotions and conduct Active Problems:   Malingering      Total Time spent with patient: 40  Musculoskeletal: Strength & Muscle Tone: within normal limits Gait & Station: normal Patient leans: N/A   Psychiatric Specialty Exam:  Presentation  General Appearance: Appropriate for Environment  Eye Contact: Good  Speech: Clear and Coherent; Normal Rate  Speech Volume: Normal  Handedness: Right   Mood and Affect  Mood: Euthymic  Affect: Congruent   Thought Process  Thought Processes: Linear  Descriptions of Associations: Intact  Orientation: Full (Time, Place and Person)  Thought Content: Logical  History of Schizophrenia/Schizoaffective disorder: No  Duration of Psychotic Symptoms: NA Hallucinations: Hallucinations: None  Ideas of Reference: None  Suicidal Thoughts: Suicidal Thoughts: No  Homicidal Thoughts: Homicidal Thoughts: No   Sensorium  Memory: Immediate Good  Judgment: Poor  Insight: Poor   Executive Functions  Concentration: Good  Attention Span: Good  Recall: Good  Fund of Knowledge: Good  Language: Good   Psychomotor Activity  Psychomotor Activity: Psychomotor Activity: Normal   Assets  Assets: Communication Skills; Social Support; Physical Health   Sleep  Sleep: Sleep: Good   Physical Exam: General: Sitting comfortably. NAD. HEENT: Normocephalic, atraumatic, MMM, EMOI Lungs: no increased work of breathing noted Heart: no cyanosis Abdomen: Non distended Musculoskeletal: FROM. No obvious deformities Skin: Warm, dry, intact. No rashes noted Neuro: No obvious focal deficits.  Gait and station are normal  Review of Systems  Constitutional: Negative.   HENT: Negative.    Eyes: Negative.    Respiratory: Negative.    Cardiovascular: Negative.   Gastrointestinal: Negative.   Genitourinary: Negative.   Skin: Negative.   Neurological: Negative.   Psychiatric/Behavioral:  Negative   Mental Status Per Nursing Assessment: Suicidal ideation indicated by patient, Thoughts of violence towards others  Demographic Factors:  Male, Low socioeconomic status, and Unemployed  Loss Factors: NA  Historical Factors: NA  Risk Reduction Factors:   Responsible for children under 84 years of age, Sense of responsibility to family, Living with another person, especially a relative, and Positive social support  Continued Clinical Symptoms:  Previous psychiatric diagnoses and treatments  Cognitive Features That Contribute To Risk:  None  Suicide Risk:  Minimal: No identifiable suicidal ideation.  Patients presenting with no risk factors but with morbid ruminations; may be classified as minimal risk based on the severity of the depressive symptoms.    Follow-up Information     [facility] Follow up on 08/19/2023.   Specialty: Behavioral Health Why: You have an appointment for medication management services on  08/19/23 at 9:30 am .  You also have an appointment for therapy services on 09/24/23 at 11:00 am  . Contact information: [address] [place] [address] [phone]                 Plan Of Care/Follow-up recommendations:  Activity: as tolerated  Diet: heart healthy  Other: -Follow-up with your outpatient psychiatric provider -instructions on appointment date, time, and address (location) are provided to you in discharge paperwork.  -Take your psychiatric medications as prescribed at discharge - instructions are provided to you in the discharge paperwork  -Follow-up with outpatient primary care doctor and other specialists -for management of preventative medicine and chronic medical issues  -Testing: Follow-up with  outpatient provider for abnormal lab results: Vitamin D Deficiency  -If you are prescribed an atypical antipsychotic medication, we recommend that your outpatient psychiatrist follow routine screening for side effects within 3 months of discharge, including monitoring: AIMS scale, height, weight, blood pressure, fasting lipid panel, HbA1c, and fasting blood sugar.   -Recommend total abstinence from alcohol, tobacco, and other illicit drug use at discharge.   -If your psychiatric symptoms recur, worsen, or if you have side effects to your psychiatric medications, call your outpatient psychiatric provider, 911, [phone] or go to the nearest emergency department.  -If suicidal thoughts occur, immediately call your outpatient psychiatric provider, 911, [phone] or go to the nearest emergency department.   Criss Alvine, MD 07/29/23 10:33 AM

## 2023-07-29 NOTE — Progress Notes (Signed)
 Patient discharged from [facility] on 07/29/23 at 1120am. Patient denies SI, plan, and intention. Suicide safety plan completed, reviewed with this RN, given to the patient, and a copy in the chart. Patient denies HI/AVH upon discharge. Patient rates her depression a 0/10 and her anxiety a 0/10. Patient is alert, oriented, and cooperative. RN provided patient with discharge paperwork and reviewed information with patient. Patient expressed that she understood all of the discharge instructions. Pt was satisfied with belongings returned to her from the locker and at bedside. Discharged patient to [facility] waiting room. [name] awaiting patient in the [facility] waiting room.
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
## 2023-07-29 NOTE — Discharge Instructions (Signed)
 Dietary Sources of Vitamin D  Animal-Based Sources (Vitamin D3 - Cholecalciferol) Fatty Fish - Salmon, mackerel, tuna, and sardines  Cholesterol Note: These fish naturally contain cholesterol, but they also provide heart-healthy omega-3s, which can help balance cholesterol levels. Cod Liver Oil - One of the richest sources  Cholesterol Note: High in cholesterol, so it should be taken in moderation, especially if you have cholesterol concerns. Egg Yolks - Especially from pasture-raised or vitamin D-enriched eggs  Cholesterol Note: Egg yolks are high in cholesterol, so consider moderating intake if you're watching cholesterol levels. Beef Liver - Contains moderate amounts  Cholesterol Note: While rich in vitamin D, beef liver is also high in cholesterol, so it should be eaten in moderation. Cheese - Small amounts, particularly in certain aged varieties  Cholesterol Note: Some cheeses contain cholesterol and saturated fat, so choosing lower-fat options can help balance intake.  Plant-Based Sources (Vitamin D2 - Ergocalciferol) Mushrooms - Especially sun-exposed varieties like maitake and shiitake Cholesterol Note: Mushrooms are naturally cholesterol-free, making them a great alternative for those avoiding cholesterol. Fortified Foods Milk & Dairy Products - Fortified milk, yogurt, and cheese  Cholesterol Note: Full-fat versions contain cholesterol, so opt for low-fat or non-fat versions if needed. Plant-Based Milk - Fortified almond, soy, oat, and coconut milk  Cholesterol Note: Most plant-based milks are cholesterol-free, making them a good option for those managing cholesterol. Cereals & Oatmeal - Many breakfast cereals are fortified  Cholesterol Note: Typically low in cholesterol, but check labels for added fats or sugars. Orange Juice - Some brands are fortified with vitamin D  Cholesterol Note: Orange juice is naturally cholesterol-free, making it a safe choice for those avoiding  cholesterol.  Bonus: Sunlight Exposure The body produces vitamin D when exposed to sunlight (UVB rays). Spending 10-30 minutes in the sun a few times a week can help boost vitamin D levels without any cholesterol concerns.  Your Doctor's Recommendation Due to a vitamin D deficiency, your doctor has recommended taking prescription vitamin D weekly for [redacted] weeks along with 5,000 units of over-the-counter (OTC) vitamin D daily.

## 2023-07-29 NOTE — Progress Notes (Signed)
  [facility] Adult Case Management Discharge Plan :  Will you be returning to the same living situation after discharge:  Yes,  home w/ supports At discharge, do you have transportation home?: Yes,  significant other. Do you have the ability to pay for your medications: Yes,  insurance benefits  Release of information consent forms completed and in the chart;  Patient's signature needed at discharge.  Patient to Follow up at:  Follow-up Information     [facility] Follow up on 08/19/2023.   Specialty: Behavioral Health Why: You have an appointment for medication management services on  08/19/23 at 9:30 am .  You also have an appointment for therapy services on 09/24/23 at 11:00 am  . Contact information: [address] [place] [address] [phone]                Next level of care provider has access to [facility] Link:no  Safety Planning and Suicide Prevention discussed: Yes,  completed with [name].  Has patient been referred to the Quitline?: Patient does not use tobacco/nicotine products  Patient has been referred for addiction treatment: No known substance use disorder.  Joelyn Oms Ronnie Moll, LCSW 07/29/2023, 9:05 AM

## 2023-07-29 NOTE — Progress Notes (Signed)
   07/29/23 0851  Psych Admission Type (Psych Patients Only)  Admission Status Voluntary  Psychosocial Assessment  Patient Complaints None  Eye Contact Fair  Facial Expression Animated  Affect Appropriate to circumstance  Speech Logical/coherent  Interaction Assertive  Motor Activity Other (Comment) (WDL)  Appearance/Hygiene Unremarkable  Behavior Characteristics Cooperative;Appropriate to situation  Mood Pleasant  Thought Process  Coherency WDL  Content WDL  Delusions None reported or observed  Perception WDL  Hallucination None reported or observed  Judgment Impaired  Confusion None  Danger to Self  Current suicidal ideation? Denies  Description of Suicide Plan n/a  Self-Injurious Behavior No self-injurious ideation or behavior indicators observed or expressed   Agreement Not to Harm Self Yes  Description of Agreement Verbal  Danger to Others  Danger to Others None reported or observed  Danger to Others Abnormal  Harmful Behavior to others No threats or harm toward other people

## 2023-08-19 ENCOUNTER — Encounter (HOSPITAL_COMMUNITY): Payer: Self-pay | Admitting: Student

## 2023-08-20 ENCOUNTER — Encounter (HOSPITAL_COMMUNITY): Payer: Self-pay | Admitting: Emergency Medicine

## 2023-08-20 ENCOUNTER — Other Ambulatory Visit: Payer: Self-pay

## 2023-08-20 ENCOUNTER — Emergency Department (HOSPITAL_COMMUNITY)
Admission: EM | Admit: 2023-08-20 | Discharge: 2023-08-21 | Disposition: A | Discharge status: Home / Self Care | Attending: Emergency Medicine | Admitting: Emergency Medicine

## 2023-08-20 DIAGNOSIS — R079 Chest pain, unspecified: Secondary | ICD-10-CM | POA: Diagnosis present

## 2023-08-20 DIAGNOSIS — M79604 Pain in right leg: Secondary | ICD-10-CM | POA: Insufficient documentation

## 2023-08-20 DIAGNOSIS — R7989 Other specified abnormal findings of blood chemistry: Secondary | ICD-10-CM | POA: Diagnosis not present

## 2023-08-20 NOTE — ED Triage Notes (Signed)
 Patient complaining of left side chest pain x2 days. Denies radiation. States the pain is worse after eating. Patient also complains of right femur pain. Denies recent injury but states he had rod inserted after fracture in 2013.

## 2023-08-21 ENCOUNTER — Emergency Department (HOSPITAL_COMMUNITY)

## 2023-08-21 LAB — BASIC METABOLIC PANEL WITH GFR
Anion gap: 13 (ref 5–15)
BUN: 9 mg/dL (ref 6–20)
CO2: 21 mmol/L — ABNORMAL LOW (ref 22–32)
Calcium: 8.4 mg/dL — ABNORMAL LOW (ref 8.9–10.3)
Chloride: 106 mmol/L (ref 98–111)
Creatinine, Ser: 1.45 mg/dL — ABNORMAL HIGH (ref 0.61–1.24)
GFR, Estimated: >60 mL/min (ref >60)
Glucose, Bld: 107 mg/dL — ABNORMAL HIGH (ref 70–99)
Potassium: 3.7 mmol/L (ref 3.5–5.1)
Sodium: 140 mmol/L (ref 135–145)

## 2023-08-21 LAB — TROPONIN I (HIGH SENSITIVITY)
Troponin I (High Sensitivity): 4 ng/L (ref <18)
Troponin I (High Sensitivity): 4 ng/L (ref <18)

## 2023-08-21 LAB — CBC
HCT: 40.5 % (ref 39.0–52.0)
Hemoglobin: 13.6 g/dL (ref 13.0–17.0)
MCH: 31.6 pg (ref 26.0–34.0)
MCHC: 33.6 g/dL (ref 30.0–36.0)
MCV: 94 fL (ref 80.0–100.0)
Platelets: 234 10*3/uL (ref 150–400)
RBC: 4.31 MIL/uL (ref 4.22–5.81)
RDW: 11.8 % (ref 11.5–15.5)
WBC: 5 10*3/uL (ref 4.0–10.5)
nRBC: 0 % (ref 0.0–0.2)

## 2023-08-21 MED ORDER — SODIUM CHLORIDE 0.9 % IV BOLUS
1000.0000 mL | Freq: Once | INTRAVENOUS | Status: AC
  Administered 2023-08-21: 1000 mL via INTRAVENOUS

## 2023-08-21 MED ORDER — FAMOTIDINE IN NACL 20-0.9 MG/50ML-% IV SOLN
20.0000 mg | Freq: Once | INTRAVENOUS | Status: AC
  Administered 2023-08-21: 20 mg via INTRAVENOUS
  Filled 2023-08-21: qty 50

## 2023-08-21 MED ORDER — PANTOPRAZOLE SODIUM 40 MG IV SOLR
40.0000 mg | Freq: Once | INTRAVENOUS | Status: AC
  Administered 2023-08-21: 40 mg via INTRAVENOUS
  Filled 2023-08-21: qty 10

## 2023-08-21 NOTE — ED Provider Notes (Signed)
 [place] EMERGENCY DEPARTMENT AT [facility] Provider Note   CSN: [id number] Arrival date & time: 08/20/23  2346     History  Chief Complaint  Patient presents with   Chest Pain   Leg Pain    Ronnie Rivera is a 38 y.o. male.  Patient presents to the emergency room complaining of left-sided chest pain which began 2 days ago.  He denies associated shortness of breath.  He states the pain is worse when eating foods.  He states that [place] fries do seem to irritate his chest pain and he typically eats these once a day.  He denies any worsening of symptoms with spicy foods and cannot think of other specific foods that seem to irritate his symptoms.  Patient also complains of right sided femur pain which is been ongoing for months.  He endorses a history of a femur fracture with subsequent rod in 2013.  He denies any new injury or trauma to the area.  He states his Careers adviser is in [place].  Past medical history significant for PTSD, generalized anxiety disorder, painful orthopedic hardware   Chest Pain Leg Pain      Home Medications Prior to Admission medications   Medication Sig Start Date End Date Taking? Authorizing Provider  Cholecalciferol 125 MCG (5000 UT) TABS Take 1 tablet (5,000 Units total) by mouth daily. 07/29/23   Golda Acre, MD  therapeutic multivitamin-minerals [facility]) tablet Take 1 tablet by mouth daily. 07/29/23 07/28/24  Golda Acre, MD  Vitamin D, Ergocalciferol, (DRISDOL) 1.25 MG (50000 UNIT) CAPS capsule Take 1 capsule (50,000 Units total) by mouth every 7 (seven) days. 08/05/23   Golda Acre, MD      Allergies    Patient has no known allergies.    Review of Systems   Review of Systems  Cardiovascular:  Positive for chest pain.    Physical Exam Updated Vital Signs BP 120/79   Pulse 100   Temp 98 F (36.7 C) (Oral)   Resp (!) 21   Ht 5\' 9"  (1.753 m)   Wt 92.1 kg   SpO2 100%   BMI 29.98 kg/m  Physical Exam Vitals and  nursing note reviewed.  Constitutional:      General: He is not in acute distress.    Appearance: He is well-developed.  HENT:     Head: Normocephalic and atraumatic.  Eyes:     Conjunctiva/sclera: Conjunctivae normal.  Cardiovascular:     Rate and Rhythm: Normal rate and regular rhythm.  Pulmonary:     Effort: Pulmonary effort is normal. No respiratory distress.     Breath sounds: Normal breath sounds.  Chest:     Chest wall: Tenderness present.     Comments: Mild tenderness to palpation of the left chest midclavicular just below the nipple line Abdominal:     Palpations: Abdomen is soft.     Tenderness: There is no abdominal tenderness.  Musculoskeletal:        General: No swelling. Normal range of motion.     Cervical back: Neck supple.     Right lower leg: No tenderness. No edema.     Left lower leg: No tenderness. No edema.     Comments: Right leg with normal range of motion, normal sensation, palpable pedal pulse.  Negative straight leg raise  Skin:    General: Skin is warm and dry.     Capillary Refill: Capillary refill takes less than 2 seconds.  Neurological:  Mental Status: He is alert.  Psychiatric:        Mood and Affect: Mood normal.     ED Results / Procedures / Treatments   Labs (all labs ordered are listed, but only abnormal results are displayed) Labs Reviewed  BASIC METABOLIC PANEL - Abnormal; Notable for the following components:      Result Value   CO2 21 (*)    Glucose, Bld 107 (*)    Creatinine, Ser 1.45 (*)    Calcium 8.4 (*)    All other components within normal limits  CBC  TROPONIN I (HIGH SENSITIVITY)  TROPONIN I (HIGH SENSITIVITY)    EKG EKG Interpretation Date/Time:  Thursday August 21 2023 00:05:12 EDT Ventricular Rate:  74 PR Interval:  136 QRS Duration:  104 QT Interval:  384 QTC Calculation: 426 R Axis:   86  Text Interpretation: Normal sinus rhythm Cannot rule out Inferior infarct , age undetermined Abnormal ECG  Confirmed by Tilden Fossa [phone]) on 08/21/2023 12:57:21 AM  Radiology DG Femur Min 2 Views Right Result Date: 08/21/2023 CLINICAL DATA:  Right upper leg pain. EXAM: RIGHT FEMUR 2 VIEWS COMPARISON:  07/16/2023 FINDINGS: Postoperative changes with intramedullary rod fixation of a healed fracture of the distal right femur. Surgical hardware appears intact without change in position since prior study. No bone erosion at the bone hardware interfaces. No acute fracture or dislocation is identified. IMPRESSION: Intramedullary rod fixation of a healed fracture of the distal femur. No acute bony abnormalities. Electronically Signed   By: Burman Nieves M.D.   On: 08/21/2023 00:22   DG Chest 2 View Result Date: 08/21/2023 CLINICAL DATA:  Chest pain.  Left-sided chest pain for 2 days. EXAM: CHEST - 2 VIEW COMPARISON:  04/16/2023 FINDINGS: Heart size and pulmonary vascularity are normal. Lungs are clear. No pleural effusion or pneumothorax. Mediastinal contours appear intact. IMPRESSION: No active cardiopulmonary disease. Electronically Signed   By: Burman Nieves M.D.   On: 08/21/2023 00:20    Procedures Procedures    Medications Ordered in ED Medications  famotidine (PEPCID) IVPB 20 mg premix (0 mg Intravenous Stopped 08/21/23 0250)  pantoprazole (PROTONIX) injection 40 mg (40 mg Intravenous Given 08/21/23 0205)  sodium chloride 0.9 % bolus 1,000 mL (0 mLs Intravenous Stopped 08/21/23 0329)    ED Course/ Medical Decision Making/ A&P                                 Medical Decision Making Amount and/or Complexity of Data Reviewed Labs: ordered. Radiology: ordered.  Risk Prescription drug management.   This patient presents to the ED for concern of chest pain, this involves an extensive number of treatment options, and is a complaint that carries with it a high risk of complications and morbidity.  The differential diagnosis includes musculoskeletal pain, GERD, ACS, pneumonia,  others. Patient also complains of right sided leg pain with differentials including chronic pain due to orthopedic hardware, fracture, dislocation, others   Co morbidities that complicate the patient evaluation  Previous right sided femur surgery   Additional history obtained:   External records from outside source obtained and reviewed including behavioral health notes   Lab Tests:  I Ordered, and personally interpreted labs.  The pertinent results include: Creatinine mildly elevated 1.45; initial and repeat troponins of 4   Imaging Studies ordered:  I ordered imaging studies including plain films of the right femur and chest I independently visualized  and interpreted imaging which showed  Intramedullary rod fixation of a healed fracture of the distal  femur. No acute bony abnormalities.  No active cardiopulmonary disease on chest x-ray I agree with the radiologist interpretation   Cardiac Monitoring: / EKG:  The patient was maintained on a cardiac monitor.  I personally viewed and interpreted the cardiac monitored which showed an underlying rhythm of: Sinus rhythm   Problem List / ED Course / Critical interventions / Medication management   I ordered medication including Pepcid, Protonix for reflux-like symptoms; saline bolus for fluid resuscitation due to increased creatinine Reevaluation of the patient after these medicines showed that the patient improved I have reviewed the patients home medicines and have made adjustments as needed   Social Determinants of Health:  Social Drivers of Health with Concerns   Financial Resource Strain: High Risk (04/15/2022)   Overall Financial Resource Strain (CARDIA)    Difficulty of Paying Living Expenses: Hard  Food Insecurity: Food Insecurity Present (07/27/2023)   Hunger Vital Sign    Worried About Running Out of Food in the Last Year: Sometimes true    Ran Out of Food in the Last Year: Sometimes true  Transportation  Needs: Unmet Transportation Needs (07/27/2023)   PRAPARE - Transportation    Lack of Transportation (Medical): Yes    Lack of Transportation (Non-Medical): Yes  Stress: Stress Concern Present (04/15/2022)   [employer] of Occupational Health - Occupational Stress Questionnaire    Feeling of Stress : Very much  Social Connections: Socially Isolated (04/15/2022)   Social Connection and Isolation Panel [NHANES]    Frequency of Communication with Friends and Family: More than three times a week    Frequency of Social Gatherings with Friends and Family: Three times a week    Attends Religious Services: Never    Active Member of Clubs or Organizations: No    Attends Banker Meetings: Never    Marital Status: Never married  Housing: High Risk (07/27/2023)   Housing Stability Vital Sign    Unable to Pay for Housing in the Last Year: Patient declined    Number of Times Moved in the Last Year: Not on file    Homeless in the Last Year: Yes      Test / Admission - Considered:  Patient with nonischemic EKG and negative troponins x 2.  No signs of ACS.  Patient symptoms improved significantly with Pepcid and Protonix.  Feel that his symptoms are likely GERD/reflux related.  Plan to have patient take Pepcid over-the-counter for symptom management.  Chest x-ray was unremarkable. Patient's right femur x-ray shows hardware but no acute findings.  Discussed with patient need for continued follow-up with his orthopedic surgeon who performed the initial procedure for further evaluation.  He has no weakness, no sensation deficits, no other life-threatening symptoms which would necessitate further emergent workup. Patient did have mildly elevated creatinine and was treated with a fluid bolus.  Unclear as to why patient's creatinine was elevated during this visit as patient denies any nausea, vomiting, diarrhea.  He denies decreased fluid intake.  Unfortunately patient currently has no primary  care provider for follow-up.  Patient plans to call referral line for help in finding a primary care provider in this area. Patient stable for discharge home at this time.  Return precautions provided.         Final Clinical Impression(s) / ED Diagnoses Final diagnoses:  Chest pain, unspecified type  Right leg pain  Elevated serum creatinine  Rx / DC Orders ED Discharge Orders     None         Pamala Duffel 08/21/23 Selinda Flavin, MD 08/21/23 [phone]

## 2023-08-21 NOTE — Discharge Instructions (Signed)
 You were seen today for chest pain.  Your workup was reassuring.  This may be related to reflux-like symptoms.  You may try over-the-counter Pepcid for symptom management.  If you have shortness of breath or other life-threatening symptoms please return to the emergency department. Please schedule a follow-up appointment with your orthopedic surgeon for further evaluation of your right leg pain.

## 2023-08-24 ENCOUNTER — Ambulatory Visit (HOSPITAL_COMMUNITY)

## 2023-08-25 ENCOUNTER — Encounter (HOSPITAL_COMMUNITY): Payer: Self-pay

## 2023-08-25 ENCOUNTER — Ambulatory Visit (HOSPITAL_COMMUNITY)
Admission: EM | Admit: 2023-08-25 | Discharge: 2023-08-25 | Disposition: A | Discharge status: Home / Self Care | Attending: Family Medicine | Admitting: Family Medicine

## 2023-08-25 DIAGNOSIS — R079 Chest pain, unspecified: Secondary | ICD-10-CM

## 2023-08-25 DIAGNOSIS — K219 Gastro-esophageal reflux disease without esophagitis: Secondary | ICD-10-CM

## 2023-08-25 DIAGNOSIS — M79604 Pain in right leg: Secondary | ICD-10-CM | POA: Diagnosis not present

## 2023-08-25 MED ORDER — OMEPRAZOLE 40 MG PO CPDR: 40.0000 mg | DELAYED_RELEASE_CAPSULE | Freq: Every day | ORAL | 1 refills | Status: DC

## 2023-08-25 MED ORDER — CETIRIZINE HCL 10 MG PO TABS: 10.0000 mg | ORAL_TABLET | Freq: Every day | ORAL | 0 refills | Status: AC | PRN
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
## 2023-08-25 NOTE — Discharge Instructions (Signed)
 Zyrtec/cetirizine 10 mg tablet--take 1 daily as needed for allergy or itching.  Take omeprazole 40 mg--1 capsule daily for stomach acid.  You can use the QR code/website at the back of the summary paperwork to schedule yourself a new patient appointment with primary care
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
## 2023-09-24 ENCOUNTER — Ambulatory Visit (HOSPITAL_COMMUNITY): Payer: MEDICAID | Admitting: Mental Health

## 2023-10-20 ENCOUNTER — Ambulatory Visit: Admitting: Family Medicine

## 2023-11-13 ENCOUNTER — Encounter (HOSPITAL_COMMUNITY): Payer: Self-pay | Admitting: Emergency Medicine

## 2023-11-13 ENCOUNTER — Other Ambulatory Visit: Payer: Self-pay

## 2023-11-13 ENCOUNTER — Ambulatory Visit (HOSPITAL_COMMUNITY)
Admission: EM | Admit: 2023-11-13 | Discharge: 2023-11-13 | Discharge status: Against Medical Advice | Payer: MEDICAID | Attending: Emergency Medicine | Admitting: Emergency Medicine

## 2023-11-13 ENCOUNTER — Ambulatory Visit (HOSPITAL_COMMUNITY)
Admission: EM | Admit: 2023-11-13 | Discharge: 2023-11-13 | Disposition: A | Discharge status: Home / Self Care | Payer: MEDICAID | Attending: Emergency Medicine | Admitting: Emergency Medicine

## 2023-11-13 ENCOUNTER — Ambulatory Visit (INDEPENDENT_AMBULATORY_CARE_PROVIDER_SITE_OTHER): Payer: MEDICAID

## 2023-11-13 DIAGNOSIS — M79604 Pain in right leg: Secondary | ICD-10-CM | POA: Diagnosis not present

## 2023-11-13 DIAGNOSIS — T8484XA Pain due to internal orthopedic prosthetic devices, implants and grafts, initial encounter: Secondary | ICD-10-CM

## 2023-11-13 DIAGNOSIS — R0602 Shortness of breath: Secondary | ICD-10-CM | POA: Diagnosis not present

## 2023-11-13 DIAGNOSIS — R0789 Other chest pain: Secondary | ICD-10-CM

## 2023-11-13 DIAGNOSIS — G8929 Other chronic pain: Secondary | ICD-10-CM | POA: Diagnosis not present

## 2023-11-13 MED ORDER — CYCLOBENZAPRINE HCL 10 MG PO TABS
10.0000 mg | ORAL_TABLET | Freq: Two times a day (BID) | ORAL | 0 refills | Status: AC | PRN
Start: 2023-11-13 — End: ?

## 2023-11-13 MED ORDER — KETOROLAC TROMETHAMINE 30 MG/ML IJ SOLN
30.0000 mg | Freq: Once | INTRAMUSCULAR | Status: AC
  Administered 2023-11-13: 30 mg via INTRAMUSCULAR

## 2023-11-13 MED ORDER — KETOROLAC TROMETHAMINE 30 MG/ML IJ SOLN
INTRAMUSCULAR | Status: AC
  Filled 2023-11-13: qty 1

## 2023-11-13 NOTE — ED Provider Notes (Signed)
 Came into room to evaluate patient and he reports that he has to pick up someone across town and then he will be back later for evaluation.   Harlow Lighter Zaven Klemens  N, FNP 11/13/23 1333

## 2023-11-13 NOTE — ED Triage Notes (Signed)
 Patient has returned after leaving earlier to pick up family member.    Patient has pain in leg.  Patient has a history of procedure and rod placement.  Reports he is ready for the rods to be removed.  Orthopedic is [facility] per patient

## 2023-11-13 NOTE — ED Triage Notes (Signed)
 Pt reports felt breathing is been off and had some back and chest pains.   C/o right leg pain where rod is for a couple days. Saturday when mowing his right leg locked up on him.

## 2023-11-13 NOTE — Discharge Instructions (Signed)
 The Toradol  injection given today should start to work in about 30 minutes. You can safely use tylenol  if needed for further pain relief. Do not use any ibuprofen /Advil   You can take the muscle relaxer FLEXERIL twice daily. If the medication makes you drowsy, take only at bed time.  Call your orthopedic specialist first thing tomorrow morning to make follow up appointment.   Please go to the emergency department if symptoms worsen

## 2023-11-13 NOTE — ED Provider Notes (Signed)
 [facility]    CSN: [id number] Arrival date & time: 11/13/23  1420      History   Chief Complaint Chief Complaint  Patient presents with   Leg Pain    HPI Ronnie Rivera is a 38 y.o. male.  Right leg pain for a year or so Comes and goes, has been seen several times for this He had femur fracture >10 years ago and rod placed. Pain on and off since then. Pain worse for the last week or so. Has not attempted any intervention yet. He reports he is ready for the hardware to be removed.  He has not called his orthopedic specialist. Denies any numbness or tingling in distal extremity.  No localized swelling.  Denies skin changes including erythema or rash.  He had image of the right femur 3 months ago, hardware in place with no acute change. Denies any new trauma or fall.   Patient had checked in earlier initially reporting shortness of breath and chest pain.  He left before being seen.  An x-ray was performed that was negative.  When he returned, he does not state any shortness of breath or chest pain.  He said he wanted to be checked for pneumonia.  He has not had cough or fever. Sometimes he feels discomfort if he turns to one side, but it goes away.  Past Medical History:  Diagnosis Date   Femur fracture (HCC) 09/19/2011   GAD (generalized anxiety disorder) 04/15/2022   Mild depression 04/15/2022   Painful orthopaedic hardware (HCC) 05/03/2014   Patellofemoral disorder of right knee 07/22/2019   PTSD (post-traumatic stress disorder) 04/15/2022   Scalp laceration 11/16/2019   Vitamin D  deficiency 07/29/2023    Patient Active Problem List   Diagnosis Date Noted   Malingering 07/28/2023   Adjustment disorder with mixed disturbance of emotions and conduct 07/26/2023   Adjustment disorder with anxious mood 07/22/2023    History reviewed. No pertinent surgical history.     Home Medications    Prior to Admission medications   Medication Sig Start Date End  Date Taking? Authorizing Provider  cyclobenzaprine (FLEXERIL) 10 MG tablet Take 1 tablet (10 mg total) by mouth 2 (two) times daily as needed for muscle spasms. 11/13/23  Yes Kenasia Scheller, Ivette Marks, PA-C  cetirizine  (ZYRTEC  ALLERGY) 10 MG tablet Take 1 tablet (10 mg total) by mouth daily as needed for allergies. 08/25/23   Ann Keto, MD  Vitamin D , Ergocalciferol , (DRISDOL ) 1.25 MG (50000 UNIT) CAPS capsule Take 1 capsule (50,000 Units total) by mouth every 7 (seven) days. 08/05/23   Timmothy Foots, MD    Family History Family History  Problem Relation Age of Onset   Cancer Mother     Social History Social History   Tobacco Use   Smoking status: Never   Smokeless tobacco: Never  Vaping Use   Vaping status: Never Used  Substance Use Topics   Alcohol use: Yes    Alcohol/week: 1.0 standard drink of alcohol    Types: 1 Glasses of wine per week    Comment: occassional   Drug use: Yes    Types: Marijuana    Comment: uses edibles occasionally.     Allergies   Patient has no known allergies.   Review of Systems Review of Systems As per HPI  Physical Exam Triage Vital Signs ED Triage Vitals  Encounter Vitals Group     BP 11/13/23 1532 127/75     Girls Systolic BP Percentile --  Girls Diastolic BP Percentile --      Boys Systolic BP Percentile --      Boys Diastolic BP Percentile --      Pulse Rate 11/13/23 1532 69     Resp 11/13/23 1532 18     Temp 11/13/23 1532 98.1 F (36.7 C)     Temp Source 11/13/23 1532 Oral     SpO2 11/13/23 1532 96 %     Weight --      Height --      Head Circumference --      Peak Flow --      Pain Score 11/13/23 1531 10     Pain Loc --      Pain Education --      Exclude from Growth Chart --    No data found.  Updated Vital Signs BP 127/75 (BP Location: Left Arm)   Pulse 69   Temp 98.1 F (36.7 C) (Oral)   Resp 18   SpO2 96%    Physical Exam Vitals and nursing note reviewed.  Constitutional:      General: He is not in  acute distress.    Appearance: He is not ill-appearing or diaphoretic.  HENT:     Mouth/Throat:     Pharynx: Oropharynx is clear.   Eyes:     Conjunctiva/sclera: Conjunctivae normal.    Cardiovascular:     Rate and Rhythm: Normal rate and regular rhythm.     Pulses: Normal pulses.     Heart sounds: Normal heart sounds.  Pulmonary:     Effort: Pulmonary effort is normal.     Breath sounds: Normal breath sounds.   Musculoskeletal:        General: No swelling, tenderness, deformity or signs of injury. Normal range of motion.     Right lower leg: No edema.   Skin:    General: Skin is warm and dry.     Capillary Refill: Capillary refill takes less than 2 seconds.     Findings: No bruising, erythema, lesion or rash.   Neurological:     Mental Status: He is alert and oriented to person, place, and time.     Comments: Strength and sensation intact      UC Treatments / Results  Labs (all labs ordered are listed, but only abnormal results are displayed) Labs Reviewed - No data to display  EKG   Radiology DG Chest 2 View Result Date: 11/13/2023 CLINICAL DATA:  Shortness of breath EXAM: CHEST - 2 VIEW COMPARISON:  X-ray 08/21/2023 and older FINDINGS: No consolidation, pneumothorax or effusion. No edema. Normal cardiopericardial silhouette. IMPRESSION: No acute cardiopulmonary disease. Electronically Signed   By: Adrianna Horde M.D.   On: 11/13/2023 14:01    Procedures Procedures (including critical care time)  Medications Ordered in UC Medications  ketorolac  (TORADOL ) 30 MG/ML injection 30 mg (30 mg Intramuscular Given 11/13/23 1636)    Initial Impression / Assessment and Plan / UC Course  I have reviewed the triage vital signs and the nursing notes.  Pertinent labs & imaging results that were available during my care of the patient were reviewed by me and considered in my medical decision making (see chart for details).  Chest xray from earlier visit is reviewed,  negative. Patient had initially reported shortness of breath and wanted to be checked for pneumonia. However on my assessment he's not had any shortness of breath or cough, no chest pain.  Unremarkable exam of the right lower extremity. No  indication to repeat xray today. Pain for several years in this leg, seen several times, no interventions attempted at home, and has not followed with his orthopedic specialist. IM toradol  given in clinic for pain today, provided with ortho information for him to call first thing tomorrow morning. Can also try flexeril BID as he thought some of the leg pain might be muscular/tension.  ED precautions  Final Clinical Impressions(s) / UC Diagnoses   Final diagnoses:  Chronic pain of right lower extremity  Chest wall pain     Discharge Instructions      The Toradol  injection given today should start to work in about 30 minutes. You can safely use tylenol  if needed for further pain relief. Do not use any ibuprofen /Advil   You can take the muscle relaxer FLEXERIL twice daily. If the medication makes you drowsy, take only at bed time.  Call your orthopedic specialist first thing tomorrow morning to make follow up appointment.   Please go to the emergency department if symptoms worsen     ED Prescriptions     Medication Sig Dispense Auth. Provider   cyclobenzaprine (FLEXERIL) 10 MG tablet Take 1 tablet (10 mg total) by mouth 2 (two) times daily as needed for muscle spasms. 20 tablet Lianette Broussard, Ivette Marks, PA-C      PDMP not reviewed this encounter.   Sitara Cashwell, Ivette Marks, [place] 11/13/23 1702

## 2023-11-22 NOTE — Patient Instructions (Incomplete)

## 2023-11-22 NOTE — Progress Notes (Deleted)
 New Patient Office Visit  Subjective    Patient ID: Ronnie Rivera, male    DOB: 1985-08-01  Age: 38 y.o. MRN: [id number]  CC: No chief complaint on file.   HPI Ronnie Rivera presents to establish care today. Up to date on routine vaccines. Up to date on routine screenings.  Receives regular dental and eye care.  Reports eating well, sleeping well, feeling well overall.  Reports compliance with medication regimen.  Denies other concerns today.  Outpatient Encounter Medications as of 11/24/2023  Medication Sig  . cetirizine  (ZYRTEC  ALLERGY) 10 MG tablet Take 1 tablet (10 mg total) by mouth daily as needed for allergies.  . cyclobenzaprine  (FLEXERIL ) 10 MG tablet Take 1 tablet (10 mg total) by mouth 2 (two) times daily as needed for muscle spasms.  . Vitamin D , Ergocalciferol , (DRISDOL ) 1.25 MG (50000 UNIT) CAPS capsule Take 1 capsule (50,000 Units total) by mouth every 7 (seven) days.   No facility-administered encounter medications on file as of 11/24/2023.    Past Medical History:  Diagnosis Date  . Femur fracture (HCC) 09/19/2011  . GAD (generalized anxiety disorder) 04/15/2022  . Mild depression 04/15/2022  . Painful orthopaedic hardware (HCC) 05/03/2014  . Patellofemoral disorder of right knee 07/22/2019  . PTSD (post-traumatic stress disorder) 04/15/2022  . Scalp laceration 11/16/2019  . Vitamin D  deficiency 07/29/2023    No past surgical history on file.  Family History  Problem Relation Age of Onset  . Cancer Mother     Social History   Socioeconomic History  . Marital status: Single    Spouse name: Not on file  . Number of children: Not on file  . Years of education: Not on file  . Highest education level: Not on file  Occupational History  . Not on file  Tobacco Use  . Smoking status: Never  . Smokeless tobacco: Never  Vaping Use  . Vaping status: Never Used  Substance and Sexual Activity  . Alcohol use: Yes    Alcohol/week: 1.0 standard  drink of alcohol    Types: 1 Glasses of wine per week    Comment: occassional  . Drug use: Yes    Types: Marijuana    Comment: uses edibles occasionally.  . Sexual activity: Yes  Other Topics Concern  . Not on file  Social History Narrative  . Not on file   Social Drivers of Health   Financial Resource Strain: High Risk (04/15/2022)   Overall Financial Resource Strain (CARDIA)   . Difficulty of Paying Living Expenses: Hard  Food Insecurity: Food Insecurity Present (07/27/2023)   Hunger Vital Sign   . Worried About Programme researcher, broadcasting/film/video in the Last Year: Sometimes true   . Ran Out of Food in the Last Year: Sometimes true  Transportation Needs: Unmet Transportation Needs (07/27/2023)   PRAPARE - Transportation   . Lack of Transportation (Medical): Yes   . Lack of Transportation (Non-Medical): Yes  Physical Activity: Sufficiently Active (04/15/2022)   Exercise Vital Sign   . Days of Exercise per Week: 7 days   . Minutes of Exercise per Session: 60 min  Stress: Stress Concern Present (04/15/2022)   [employer] of Occupational Health - Occupational Stress Questionnaire   . Feeling of Stress : Very much  Social Connections: Socially Isolated (04/15/2022)   Social Connection and Isolation Panel   . Frequency of Communication with Friends and Family: More than three times a week   . Frequency of Social Gatherings with  Friends and Family: Three times a week   . Attends Religious Services: Never   . Active Member of Clubs or Organizations: No   . Attends Banker Meetings: Never   . Marital Status: Never married  Intimate Partner Violence: Not At Risk (07/27/2023)   Humiliation, Afraid, Rape, and Kick questionnaire   . Fear of Current or Ex-Partner: No   . Emotionally Abused: No   . Physically Abused: No   . Sexually Abused: No    ROS Per HPI      Objective    There were no vitals taken for this visit.  Physical Exam      Assessment & Plan:   There are  no diagnoses linked to this encounter.   No follow-ups on file.   Corean LITTIE Ku, FNP

## 2023-11-24 ENCOUNTER — Ambulatory Visit (INDEPENDENT_AMBULATORY_CARE_PROVIDER_SITE_OTHER): Payer: MEDICAID | Admitting: Family Medicine

## 2023-11-24 ENCOUNTER — Ambulatory Visit: Admitting: Family Medicine

## 2023-11-24 ENCOUNTER — Ambulatory Visit: Payer: Self-pay

## 2023-11-24 ENCOUNTER — Encounter: Payer: Self-pay | Admitting: Family Medicine

## 2023-11-24 ENCOUNTER — Ambulatory Visit: Payer: Self-pay | Admitting: Family Medicine

## 2023-11-24 ENCOUNTER — Ambulatory Visit (INDEPENDENT_AMBULATORY_CARE_PROVIDER_SITE_OTHER): Payer: MEDICAID

## 2023-11-24 VITALS — BP 120/82 | HR 68 | Temp 97.8°F | Ht 69.0 in | Wt 207.4 lb

## 2023-11-24 DIAGNOSIS — E66811 Obesity, class 1: Secondary | ICD-10-CM | POA: Diagnosis not present

## 2023-11-24 DIAGNOSIS — M25561 Pain in right knee: Secondary | ICD-10-CM | POA: Diagnosis not present

## 2023-11-24 DIAGNOSIS — E6609 Other obesity due to excess calories: Secondary | ICD-10-CM

## 2023-11-24 DIAGNOSIS — G8929 Other chronic pain: Secondary | ICD-10-CM

## 2023-11-24 DIAGNOSIS — Z6830 Body mass index (BMI) 30.0-30.9, adult: Secondary | ICD-10-CM

## 2023-11-24 LAB — COMPREHENSIVE METABOLIC PANEL WITH GFR
ALT: 19 U/L (ref 0–53)
AST: 21 U/L (ref 0–37)
Albumin: 4.1 g/dL (ref 3.5–5.2)
Alkaline Phosphatase: 62 U/L (ref 39–117)
BUN: 12 mg/dL (ref 6–23)
CO2: 30 meq/L (ref 19–32)
Calcium: 8.9 mg/dL (ref 8.4–10.5)
Chloride: 103 meq/L (ref 96–112)
Creatinine, Ser: 1.03 mg/dL (ref 0.40–1.50)
GFR: 92.54 mL/min (ref >60.00)
Glucose, Bld: 87 mg/dL (ref 70–99)
Potassium: 4 meq/L (ref 3.5–5.1)
Sodium: 140 meq/L (ref 135–145)
Total Bilirubin: 0.6 mg/dL (ref 0.2–1.2)
Total Protein: 6.3 g/dL (ref 6.0–8.3)

## 2023-11-24 LAB — CBC WITH DIFFERENTIAL/PLATELET
Basophils Absolute: 0 10*3/uL (ref 0.0–0.1)
Basophils Relative: 0.3 % (ref 0.0–3.0)
Eosinophils Absolute: 0 10*3/uL (ref 0.0–0.7)
Eosinophils Relative: 0.8 % (ref 0.0–5.0)
HCT: 40.6 % (ref 39.0–52.0)
Hemoglobin: 13.6 g/dL (ref 13.0–17.0)
Lymphocytes Relative: 36.8 % (ref 12.0–46.0)
Lymphs Abs: 1.5 10*3/uL (ref 0.7–4.0)
MCHC: 33.6 g/dL (ref 30.0–36.0)
MCV: 91.7 fl (ref 78.0–100.0)
Monocytes Absolute: 0.4 10*3/uL (ref 0.1–1.0)
Monocytes Relative: 8.8 % (ref 3.0–12.0)
Neutro Abs: 2.2 10*3/uL (ref 1.4–7.7)
Neutrophils Relative %: 53.3 % (ref 43.0–77.0)
Platelets: 250 10*3/uL (ref 150.0–400.0)
RBC: 4.43 Mil/uL (ref 4.22–5.81)
RDW: 12.2 % (ref 11.5–15.5)
WBC: 4.2 10*3/uL (ref 4.0–10.5)

## 2023-11-24 NOTE — Telephone Encounter (Signed)
 FYI Only or Action Required?: FYI only for provider.  Patient was last seen in primary care on no pcp. Called Nurse Triage reporting Leg Pain. Pt has rod in leg. Leg is now hurting. Pt called because he was running late for NP appt. Symptoms began A while ago. Interventions attempted: Nothing. Symptoms are: gradually worsening.  Triage Disposition: See HCP Within 4 Hours (Or PCP Triage) - CAL has gotten him re-scheduled for 1pm  Patient/caregiver understands and will follow disposition?: Yes                  Copied from CRM [id number]. Topic: Clinical - Red Word Triage >> Nov 24, 2023  9:04 AM [place] E wrote: Reason for CRM: Patient has rod in femur from accident years ago and now the rod is causing pain, going on for a month. Rod is in right femur. Pain has been 10 out of 10. Reason for Disposition  [1] Thigh or calf pain AND [2] only 1 side AND [3] present > 1 hour (Exception: Chronic unchanged pain.)  Answer Assessment - Initial Assessment Questions 1. ONSET: When did the pain start?      leg 2. LOCATION: Where is the pain located?      ongoing 3. PAIN: How bad is the pain?    (Scale 1-10; or mild, moderate, severe)   -  MILD (1-3): doesn't interfere with normal activities    -  MODERATE (4-7): interferes with normal activities (e.g., work or school) or awakens from sleep, limping    -  SEVERE (8-10): excruciating pain, unable to do any normal activities, unable to walk     10/10 4. WORK OR EXERCISE: Has there been any recent work or exercise that involved this part of the body?      sx 5. CAUSE: What do you think is causing the leg pain?     Rod in right  Protocols used: Leg Pain-A-AH

## 2023-11-24 NOTE — Progress Notes (Signed)
   Acute Office Visit  Subjective:     Patient ID: Ronnie Rivera, male    DOB: 12/13/1985, 38 y.o.   MRN: [id number]  No chief complaint on file.   HPI Patient is in to establish care today. Reports chronic right knee pain and popping. Has been using IcyHot to the area with mild relief. States that pain is chronic.  Has history of bilateral femur fractures.  He feels like hardware needs to be removed. Has not had primary care in years. He is not fasting today. Denies other concerns today  Reports concerns for possible erectile dysfunction.  Does not go into much more detail.  ROS Per HPI      Objective:    BP 120/82 (BP Location: Left Arm, Patient Position: Sitting)   Pulse 68   Temp 97.8 F (36.6 C) (Temporal)   Ht 5' 9 (1.753 m)   Wt 207 lb 6.4 oz (94.1 kg)   SpO2 97%   BMI 30.63 kg/m    Physical Exam Vitals and nursing note reviewed.  Constitutional:      General: He is not in acute distress.    Appearance: Normal appearance.  HENT:     Head: Normocephalic and atraumatic.     Right Ear: External ear normal.     Left Ear: External ear normal.     Nose: Nose normal.     Mouth/Throat:     Mouth: Mucous membranes are moist.     Pharynx: Oropharynx is clear.   Eyes:     Extraocular Movements: Extraocular movements intact.    Cardiovascular:     Rate and Rhythm: Normal rate.  Pulmonary:     Effort: Pulmonary effort is normal.   Musculoskeletal:     Cervical back: Normal range of motion.     Right lower leg: No edema.     Left lower leg: No edema.     Comments: Tenderness to superior medial aspect of the right knee.  No obvious erythema, heat, bruising, swelling, obvious deformity.  Lymphadenopathy:     Cervical: No cervical adenopathy.   Skin:    General: Skin is warm and dry.   Neurological:     General: No focal deficit present.     Mental Status: He is alert and oriented to person, place, and time.   Psychiatric:        Mood and Affect:  Mood normal.        Behavior: Behavior normal.     No results found for any visits on 11/24/23.      Assessment & Plan:   Chronic pain of right knee -     DG Knee Complete 4 Views Right; Future  Class 1 obesity due to excess calories without serious comorbidity with body mass index (BMI) of 30.0 to 30.9 in adult -     CBC with Differential/Platelet -     Comprehensive metabolic panel with GFR     No orders of the defined types were placed in this encounter.   No follow-ups on file.  Corean LITTIE Ku, FNP

## 2023-12-08 ENCOUNTER — Other Ambulatory Visit: Payer: Self-pay

## 2023-12-08 ENCOUNTER — Encounter (HOSPITAL_COMMUNITY): Payer: Self-pay

## 2023-12-08 ENCOUNTER — Emergency Department (HOSPITAL_COMMUNITY)
Admission: EM | Admit: 2023-12-08 | Discharge: 2023-12-08 | Disposition: A | Discharge status: Home / Self Care | Payer: MEDICAID | Attending: Emergency Medicine | Admitting: Emergency Medicine

## 2023-12-08 ENCOUNTER — Emergency Department (HOSPITAL_COMMUNITY): Payer: MEDICAID

## 2023-12-08 DIAGNOSIS — G8929 Other chronic pain: Secondary | ICD-10-CM | POA: Diagnosis not present

## 2023-12-08 DIAGNOSIS — M25561 Pain in right knee: Secondary | ICD-10-CM | POA: Diagnosis present

## 2023-12-08 NOTE — ED Notes (Signed)
 Pt left prior to being given discharge papers or vitals rechecked

## 2023-12-08 NOTE — ED Provider Notes (Signed)
 [facility] Provider Note   CSN: [id number] Arrival date & time: 12/08/23  1704     Patient presents with: Rt knee pain   Ronnie Rivera is a 38 y.o. male with medical history of bilateral femur fractures with hardware placement, chronic pain of right knee.  Patient presents to ED for evaluation of right knee pain.  States that he has had on and off right knee pain for quite some time ever since suffering bilateral femur fractures and resulting hardware placement back in 2013.  Reports he does not take medications, thugs it out.  Denies any new trauma to his knee.  Reports that he used to work in Holiday [employer] but is currently unemployed.  States that he feels as if his hardware in his right femur needs to be replaced.  States that he to had his hardware and left femur removed some number of years ago.  He has not followed up with his orthopedic doctor.  He states that he saw his PCP for this a few days ago and had x-rays of right knee ordered.  Has not obtained these.  Denies fevers, nausea or vomiting.   HPI     Prior to Admission medications   Medication Sig Start Date End Date Taking? Authorizing Provider  cetirizine  (ZYRTEC  ALLERGY) 10 MG tablet Take 1 tablet (10 mg total) by mouth daily as needed for allergies. 08/25/23   Vonna Sharlet POUR, MD  cyclobenzaprine  (FLEXERIL ) 10 MG tablet Take 1 tablet (10 mg total) by mouth 2 (two) times daily as needed for muscle spasms. 11/13/23   Rising, Rebecca, PA-C  Vitamin D , Ergocalciferol , (DRISDOL ) 1.25 MG (50000 UNIT) CAPS capsule Take 1 capsule (50,000 Units total) by mouth every 7 (seven) days. 08/05/23   Kennyth Starleen RAMAN, MD    Allergies: Patient has no known allergies.    Review of Systems  Musculoskeletal:  Positive for arthralgias.  All other systems reviewed and are negative.   Updated Vital Signs BP (!) 143/92 (BP Location: Right Arm)   Pulse 63   Temp 98.5 F (36.9 C)   Resp 18    Ht 5' 9 (1.753 m)   Wt 96.6 kg   SpO2 99%   BMI 31.45 kg/m   Physical Exam Vitals and nursing note reviewed.  Constitutional:      General: He is not in acute distress.    Appearance: He is well-developed.  HENT:     Head: Normocephalic and atraumatic.  Eyes:     Conjunctiva/sclera: Conjunctivae normal.  Cardiovascular:     Rate and Rhythm: Normal rate and regular rhythm.     Heart sounds: No murmur heard. Pulmonary:     Effort: Pulmonary effort is normal. No respiratory distress.     Breath sounds: Normal breath sounds.  Abdominal:     Palpations: Abdomen is soft.     Tenderness: There is no abdominal tenderness.  Musculoskeletal:        General: No swelling.     Cervical back: Neck supple.     Comments: Full ROM of right knee appreciated.  Actively and passively.  No overlying skin change, swelling, erythema or warmth.  Skin:    General: Skin is warm and dry.     Capillary Refill: Capillary refill takes less than 2 seconds.  Neurological:     Mental Status: He is alert.  Psychiatric:        Mood and Affect: Mood normal.     (  all labs ordered are listed, but only abnormal results are displayed) Labs Reviewed - No data to display  EKG: None  Radiology: DG Knee Complete 4 Views Right Result Date: 12/08/2023 CLINICAL DATA:  R knee pain, history of surgeries EXAM: RIGHT KNEE - COMPLETE 4+ VIEW COMPARISON:  November 24, 2023 FINDINGS: Partially visualized femoral intramedullary nail with multiple locking screws along the distal femur. No periprosthetic lucency or hardware failure. Posttraumatic deformity of the partially visualized distal femur.No acute fracture or dislocation. No joint effusion. Mild patellofemoral joint space loss, consistent with osteoarthritis. Soft tissues are unremarkable. IMPRESSION: No acute fracture or dislocation. Electronically Signed   By: Rogelia Myers M.D.   On: 12/08/2023 20:25     Procedures   Medications Ordered in the ED - No data to  display   Medical Decision Making  38 year old male presents for evaluation.  Please see HPI for further details.  38 year old male with history of chronic right knee pain presents for right knee pain.  Denies any new features to right knee pain.  Denies any new trauma.  Saw PCP a few days ago for this and had outpatient x-ray imaging ordered.  Has not completed this.  On examination he has no erythema, warmth, swelling to his right knee.  He has full range of motion on examination actively and passively.  X-ray imaging ordered in triage shows no acute process, does show osteoarthritis.  The patient was advised to begin utilizing Tylenol , NSAIDs for relief of right knee pain.  Advised to elevate, ice his knee.  He was advised to follow-up with his orthopedic surgeon for further management and care and he voiced understanding.  He had all of his questions answered his satisfaction.  Stable to discharge.     Final diagnoses:  Chronic pain of right knee    ED Discharge Orders     None          Ruthell Lonni JULIANNA DEVONNA 12/08/23 2233    Towana Ozell BROCKS, MD 12/09/23 1030

## 2023-12-08 NOTE — ED Provider Triage Note (Signed)
 Emergency Medicine Provider Triage Evaluation Note  Ronnie Rivera , a 38 y.o. male  was evaluated in triage.  Pt complains of R knee pain.  Had history of trauma to both legs in 2011, had rods placed in both femurs, however rod from left femur was removed in 2015.  Patient states that he feels that the rod in his right leg needs to come out.  He has been having to walk a great deal as he does not have a car, feels that his right knee popped out of place twice today, however this is since resolved.  Review of Systems  Positive: As above Negative: As above  Physical Exam  BP (!) 138/91 (BP Location: Right Arm)   Pulse 81   Temp 98.6 F (37 C)   Resp 18   Ht 5' 9 (1.753 m)   Wt 96.6 kg   SpO2 97%   BMI 31.45 kg/m  Gen:   Awake, no distress   Resp:  Normal effort  MSK:   Moves extremities without difficulty  Other:  Right knee: Surgical scarring, full ROM, no obvious deformity or dislocation  Medical Decision Making  Medically screening exam initiated at 8:07 PM.  Appropriate orders placed.  Ronnie Rivera was informed that the remainder of the evaluation will be completed by another provider, this initial triage assessment does not replace that evaluation, and the importance of remaining in the ED until their evaluation is complete.     Ronnie Rivera, [place] 12/08/23 2009

## 2023-12-08 NOTE — ED Triage Notes (Signed)
 Pt came in via POV d/t Rt knee pain & felt a bone pop out of place while sitting. States he has hardware in the knee & was told by the surgeon he may need to have something fixed if this keeps happening. A/Ox4, rates his pain 10/10 during triage.

## 2023-12-08 NOTE — Discharge Instructions (Addendum)
 It was a pleasure taking part in your care.  As discussed, please begin taking ibuprofen , Tylenol  for pain in your right knee.  You may apply ice and elevate your right knee at night for swelling.  Please follow-up with Dr. Halvorson of sports medicine at [facility].  His number is [phone]. Please return to the ED with any new or worsening symptoms.

## 2024-02-04 ENCOUNTER — Encounter (HOSPITAL_COMMUNITY): Payer: Self-pay | Admitting: Emergency Medicine

## 2024-02-04 ENCOUNTER — Emergency Department (HOSPITAL_COMMUNITY)
Admission: EM | Admit: 2024-02-04 | Discharge: 2024-02-04 | Disposition: A | Discharge status: Home / Self Care | Attending: Emergency Medicine | Admitting: Emergency Medicine

## 2024-02-04 ENCOUNTER — Emergency Department (HOSPITAL_COMMUNITY)

## 2024-02-04 DIAGNOSIS — G8929 Other chronic pain: Secondary | ICD-10-CM | POA: Diagnosis not present

## 2024-02-04 DIAGNOSIS — M25561 Pain in right knee: Secondary | ICD-10-CM | POA: Insufficient documentation

## 2024-02-04 NOTE — ED Notes (Signed)
 Per Butler Gull, called 3 times to be taken to room; no answer

## 2024-02-04 NOTE — Discharge Instructions (Signed)
 Follow-up with your original orthopedic surgeon or at least that same practice.  No concerning findings on your exam or x-ray.  Take Tylenol  and ibuprofen  for pain control.  Return for any emergent symptoms.

## 2024-02-04 NOTE — ED Provider Notes (Signed)
 [place] EMERGENCY DEPARTMENT AT [facility] Provider Note   CSN: [id number] Arrival date & time: 02/04/24  1118     Patient presents with: Knee Pain   Ronnie Rivera is a 38 y.o. male.   38 year old male presents today for concern of right knee pain.  He has hardware in his knee and states he would like it removed.  He states he had hardware placed in 2011 in both knees.  He underwent hardware removal in 2015 in the left knee and has done well since then and states he is having pain in the right knee now and would like the hardware removed.  This was done by a Careers adviser in [place].  He states he was told he may need to be removed in the future.  Denies any other complaints.  Able to ambulate without much difficulty.  The history is provided by the patient. No language interpreter was used.       Prior to Admission medications   Medication Sig Start Date End Date Taking? Authorizing Provider  cetirizine  (ZYRTEC  ALLERGY) 10 MG tablet Take 1 tablet (10 mg total) by mouth daily as needed for allergies. 08/25/23   Vonna Sharlet POUR, MD  cyclobenzaprine  (FLEXERIL ) 10 MG tablet Take 1 tablet (10 mg total) by mouth 2 (two) times daily as needed for muscle spasms. 11/13/23   Rising, Rebecca, PA-C  Vitamin D , Ergocalciferol , (DRISDOL ) 1.25 MG (50000 UNIT) CAPS capsule Take 1 capsule (50,000 Units total) by mouth every 7 (seven) days. 08/05/23   Kennyth Starleen RAMAN, MD    Allergies: Patient has no known allergies.    Review of Systems  Updated Vital Signs BP 132/70 (BP Location: Right Arm)   Pulse 83   Temp 98.6 F (37 C) (Oral)   Resp 15   Ht 5' 9 (1.753 m)   Wt 96.6 kg   SpO2 100%   BMI 31.45 kg/m   Physical Exam Vitals and nursing note reviewed.  Constitutional:      General: He is not in acute distress.    Appearance: Normal appearance. He is not ill-appearing.  HENT:     Head: Normocephalic and atraumatic.     Nose: Nose normal.  Eyes:      Conjunctiva/sclera: Conjunctivae normal.  Cardiovascular:     Rate and Rhythm: Normal rate.  Pulmonary:     Effort: Pulmonary effort is normal. No respiratory distress.  Musculoskeletal:        General: No deformity. Normal range of motion.     Cervical back: Normal range of motion.     Comments: Right knee without any warmth, erythema, swelling, or deformity.  Good range of motion in the right knee.  Neurovascularly intact bilateral lower extremities.  Bilateral ankles with good range of motion.  Bilateral hips with good range of motion without tenderness to palpation.  Compartments are soft in the right lower extremity.  No tenderness to palpation over the right knee.  Skin:    Findings: No rash.  Neurological:     Mental Status: He is alert.     (all labs ordered are listed, but only abnormal results are displayed) Labs Reviewed - No data to display  EKG: None  Radiology: DG Knee Complete 4 Views Right Result Date: 02/04/2024 CLINICAL DATA:  Right knee pain EXAM: RIGHT KNEE - COMPLETE 4+ VIEW COMPARISON:  12/08/2023 FINDINGS: Stable deformity from remote distal femoral shaft fracture. Retrograde femoral IM nail noted with two transverse distal interlocking screws and  also a separate screw just medial to the IM nail in the distal femoral metadiaphysis. No definite knee effusion. Calcifications in the knee joint potentially posteromedially and also potentially along the intercondylar notch region. Posterolateral os ossific structure at the joint line level could conceivably be a fabella or a free osteochondral fragment. Previous osteochondral fragment along the patellofemoral joint is no longer well seen and may have migrated posteriorly. No new fracture is identified. Minimal heterotopic ossification along the distal quadriceps musculature, unchanged. IMPRESSION: 1. Previous osteochondral fragment along the patellofemoral joint is no longer well seen and may have migrated posteriorly. 2.  Calcifications in the knee joint potentially posteromedially and also potentially along the intercondylar notch region. 3. Stable deformity from remote distal femoral shaft fracture. 4. Stable retrograde femoral IM nail. Electronically Signed   By: Ryan Salvage M.D.   On: 02/04/2024 12:34     Procedures   Medications Ordered in the ED - No data to display                                  Medical Decision Making Amount and/or Complexity of Data Reviewed Radiology: ordered.   38 year old male presents today for concern of right knee pain ongoing for months.  Would like his hardware removed.  X-ray obtained and shows no acute abnormality.  He has good range of motion.  No overlying warmth, swelling, or deformity.  Discussed best course of action would be to follow-up with his surgeon.  If he is unable to do so for any reason I have given him follow-up to her local surgeon but stated that the best thing would be to follow-up with the original surgeon or at least that same practice.  He is in agreement.  Supportive care discussed.  Discharged in stable condition.  PCP referral given at his request.   Final diagnoses:  Chronic pain of right knee    ED Discharge Orders     None          Hildegard Loge, PA-C 02/04/24 1744    Dean Clarity, MD 02/04/24 1800

## 2024-02-04 NOTE — ED Triage Notes (Addendum)
 Quick triage note- Pt ambulatory to ER and report sharp pain to right knee that started this morning.

## 2024-04-15 ENCOUNTER — Emergency Department (HOSPITAL_COMMUNITY): Payer: MEDICAID

## 2024-04-15 ENCOUNTER — Other Ambulatory Visit: Payer: Self-pay

## 2024-04-15 ENCOUNTER — Emergency Department (HOSPITAL_COMMUNITY)
Admission: EM | Admit: 2024-04-15 | Discharge: 2024-04-16 | Disposition: A | Discharge status: Home / Self Care | Payer: MEDICAID | Attending: Emergency Medicine | Admitting: Emergency Medicine

## 2024-04-15 DIAGNOSIS — M25561 Pain in right knee: Secondary | ICD-10-CM | POA: Diagnosis present

## 2024-04-15 NOTE — ED Triage Notes (Signed)
 Pt c/o right knee pain starting today after moving

## 2024-04-16 MED ORDER — KETOROLAC TROMETHAMINE 15 MG/ML IJ SOLN
15.0000 mg | Freq: Once | INTRAMUSCULAR | Status: AC
  Administered 2024-04-16: 15 mg via INTRAMUSCULAR
  Filled 2024-04-16: qty 1

## 2024-04-16 NOTE — ED Notes (Signed)
 Pt stated he was going to cafeteria, but stated  I am still in pain.
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
## 2024-04-16 NOTE — ED Notes (Signed)
 Ace wrap applied to right knee
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
## 2024-04-26 NOTE — ED Provider Notes (Signed)
 Patient placed in [facility] pathway, seen and evaluated for chief complaint of chills.  Pt endorses 3-4 hours of chills, no fevers or cough.  Pt also endorses diffuse chest ache.  Pertinent exam findings include NAD.   Patient/parent counseled on process, plan, and necessity for staying for completing the evaluation.      Note By: Peyton Gammons, PA-C 5:36 PM   Emergency Department Provider Note  Dragon voice dictation used for charting.    Provider at bedside: 12:40 AM  History obtained from the: Patient  History   Chief Complaint  Patient presents with  . Chills       . Chest Pain     HPI  Ronnie Rivera is a 38 y.o. male who presents to the ED with multiple complaints.  Patient endorses 3 to 4 hours of chills and chest discomfort he denies any fevers, cough, nasal congestion, sore throat, nausea, vomiting, or diarrhea.  Chest discomfort is diffuse in nature, but does not radiate to the neck, arms, or back.  He denies any associated shortness of breath, nausea, or diaphoresis.  Patient has no history of PE/DVT or cardiac disease.    No LMP for male patient.   Past Medical History Medical History[1]  Past Surgical History Surgical History[2]    Allergies Allergies[3]   Family History Family History[4]   Social History Social History[5]    Physical Exam   Vitals:   04/26/24 1731 04/26/24 2206  BP: 125/79 (!) 129/113  BP Location: Right arm Right arm  Patient Position: Sitting Sitting  Pulse: 84 85  Resp: 18 17  Temp: 97.9 F (36.6 C) 98.3 F (36.8 C)  TempSrc: Oral Oral  SpO2: 100% 99%  Weight: 98.4 kg (217 lb)   Height: 175.3 cm (5' 9)     Physical Exam Vitals and nursing note reviewed.  Constitutional:      General: He is not in acute distress.    Appearance: He is well-developed. He is not toxic-appearing.  HENT:     Head: Normocephalic and atraumatic.     Right Ear: External ear normal.     Left Ear: External ear normal.   Eyes:     General: Lids are normal.     Conjunctiva/sclera: Conjunctivae normal.  Cardiovascular:     Rate and Rhythm: Normal rate and regular rhythm.     Pulses:          Dorsalis pedis pulses are 2+ on the right side and 2+ on the left side.       Posterior tibial pulses are 2+ on the right side and 2+ on the left side.     Heart sounds: Normal heart sounds.  Pulmonary:     Effort: Pulmonary effort is normal.     Breath sounds: Normal breath sounds.  Musculoskeletal:     Right lower leg: No edema.     Left lower leg: No edema.  Neurological:     Mental Status: He is alert.  Psychiatric:        Mood and Affect: Mood normal.        Behavior: Behavior normal.     Labs   Lab Results (last 24 hours)     Procedure Component Value Ref Range Date/Time   Troponin, High Sensitive (2 Hr Rfx) [[id number]]  (Normal) Collected: 04/26/24 2032   Lab Status: Final result Specimen: Blood from Venous Updated: 04/26/24 2116    Troponin, High Sensitive 4 <20 ng/L     Comment: >=  20 ng/L INDICATES MYOCARDIAL DAMAGE.THE DIAGNOSIS OF MYOCARDIAL INFARCTION REQUIRES CLINICAL CORRELATION.   Elevated troponin may also be due to myocardial stress from a variety of causes.   Alkaline Phos (ALP) levels >400 U/L may cause falsely elevated results. Troponin test is invalid in patients taking asfotase alpha.   This troponin assay was not validated for evaluation of troponin in patients younger than 21 years. There are no ranges established for patients younger than 21 years. Therefore, laboratory results for these patients should be  interpreted with caution.      Comprehensive Metabolic Panel [[id number]]  (Abnormal) Collected: 04/26/24 1834   Lab Status: Final result Specimen: Blood from Venous Updated: 04/26/24 1920    Sodium 141 136 - 145 mmol/L     Potassium 4.1 3.4 - 4.5 mmol/L     Chloride 109* 98 - 107 mmol/L     CO2 24 21 - 31 mmol/L     Comment: High lactate dehydrogenase (LDH)  concentrations in patient samples may cause falsely increased bicarbonate results. If markedly elevated LDH levels are suspected, please assess results in conjunction with patient's LDH values.      Anion Gap 8 6 - 14 mmol/L     Glucose, Random 94 70 - 99 mg/dL     Blood Urea Nitrogen (BUN) 13 7 - 25 mg/dL     Creatinine 9.09 9.29 - 1.30 mg/dL     eGFR >09 >40 fO/fpw/8.26f7     Comment: GFR estimated by CKD-EPI equations(NKF 2021).   Recommend confirmation of Cr-based eGFR by using Cys-based eGFR and other filtration markers (if applicable) in complex cases and clinical decision-making, as needed.      Albumin 3.8 3.5 - 5.7 g/dL     Total Protein 5.6* 6.4 - 8.9 g/dL     Bilirubin, Total 0.4 0.3 - 1.0 mg/dL     Alkaline Phosphatase (ALP) 60 34 - 104 U/L     Aspartate Aminotransferase (AST) 22 13 - 39 U/L     Alanine Aminotransferase (ALT) 21 7 - 52 U/L     Calcium 8.3* 8.6 - 10.3 mg/dL     BUN/Creatinine Ratio --    Comment: Creatinine is normal, ratio is not clinically indicated.      Troponin, High Sensitive (0 Hr + 2 Hr Rfx) [[id number]]  (Normal) Collected: 04/26/24 1833   Lab Status: Final result Specimen: Blood from Venous Updated: 04/26/24 1925    Troponin, High Sensitive 3 <20 ng/L     Comment: >= 20 ng/L INDICATES MYOCARDIAL DAMAGE.THE DIAGNOSIS OF MYOCARDIAL INFARCTION REQUIRES CLINICAL CORRELATION.   Elevated troponin may also be due to myocardial stress from a variety of causes.   Alkaline Phos (ALP) levels >400 U/L may cause falsely elevated results. Troponin test is invalid in patients taking asfotase alpha.   This troponin assay was not validated for evaluation of troponin in patients younger than 21 years. There are no ranges established for patients younger than 21 years. Therefore, laboratory results for these patients should be  interpreted with caution.      CBC with Differential [[id number]]  (Abnormal) Collected: 04/26/24 1833   Lab Status: Final result  Specimen: Blood from Venous Updated: 04/26/24 1851   Narrative:     The following orders were created for panel order CBC with Differential. Procedure                               Abnormality  Status                    ---------                               -----------         ------                    CBC with Differential[[id number]]       Abnormal            Final result               Please view results for these tests on the individual orders.   CBC with Differential [[id number]]  (Abnormal) Collected: 04/26/24 1833   Lab Status: Final result Specimen: Blood from Venous Updated: 04/26/24 1851    WBC 3.68* 4.40 - 11.00 10*3/uL     RBC 5.18 4.50 - 5.90 10*6/uL     Hemoglobin 15.7 14.0 - 17.5 g/dL     Hematocrit 53.1 58.4 - 50.4 %     Mean Corpuscular Volume (MCV) 90.4 80.0 - 96.0 fL     Mean Corpuscular Hemoglobin (MCH) 30.3 27.5 - 33.2 pg     Mean Corpuscular Hemoglobin Conc (MCHC) 33.6 33.0 - 37.0 g/dL     Red Cell Distribution Width (RDW) 12.5 12.3 - 17.0 %     Platelet Count (PLT) 237 150 - 450 10*3/uL     Mean Platelet Volume (MPV) 8.6 6.8 - 10.2 fL     Neutrophils % 51 %     Lymphocytes % 37 %     Monocytes % 10 %     Eosinophils % 2 %     Basophils % 1 %     Neutrophils Absolute 1.90 1.80 - 7.80 10*3/uL     Lymphocytes # 1.40 1.00 - 4.80 10*3/uL     Monocytes # 0.40 0.00 - 0.80 10*3/uL     Eosinophils # 0.10 0.00 - 0.50 10*3/uL     Basophils # 0.00 0.00 - 0.20 10*3/uL          Radiology   Radiology Results (last 72 hours)     Procedure Component Value Units Date/Time   XR Chest 1 View [[id number]] Collected: 04/26/24 1911   Order Status: Completed Updated: 04/26/24 1913   Narrative:     Date of Service: 2024-04-26 17:39:00  EXAM: XR Chest, 1 View.   CLINICAL HISTORY: Chest pain.  COMPARISON: None provided.  FINDINGS: Portable semierect chest 6:26 PM hours.  LUNGS AND PLEURA: The lungs are clear.  Lung volumes are decreased. No pleural  effusion or pneumothorax.   HEART AND MEDIASTINUM: The heart size and mediastinal contours are normal.   BONES: No acute osseous abnormality.     Impression:      No acute cardiopulmonary pathology.  Electronically signed by: Toribio CHRISTELLA Lee, MD on 04/26/2024 07:11:44 PM US [place]        EKG     Encounter Date: 04/26/24  ECG 12 lead   Narrative   Ventricular Rate                   76        BPM                  Atrial Rate  76        BPM                  P-R Interval                       144       ms                   QRS Duration                       86        ms                   Q-T Interval                       380       ms                   QTC Calculation Bazett             427       ms                   Calculated P Axis                  36        degrees              Calculated R Axis                  60        degrees              Calculated T Axis                  21        degrees               Sinus rhythm Normal ECG No previous ECGs available     ED Course   ED Course as of 04/27/24 0057  Tue Apr 27, 2024  0051 Pt asks to wait for ride in lobby til morning [AS]    ED Course User Index [AS] Allyson J Schlagheck, PA-C    Procedure Note   Procedures  Medical Decision Making   Clinical Complexity  Patient's presentation is most consistent with acute presentation with potential threat to life or bodily function.    Provider time spent in patient care today, inclusive of but not limited to clinical reassessment, review of diagnostic studies, and discharge preparation, was greater than 30 minutes.    Medical Decision Making Problems Addressed: Chest discomfort: complicated acute illness or injury Chills: complicated acute illness or injury  Amount and/or Complexity of Data Reviewed Labs: ordered. Decision-making details documented in ED Course. Radiology: ordered and independent interpretation performed.  Decision-making details documented in ED Course. ECG/medicine tests: ordered and independent interpretation performed. Decision-making details documented in ED Course.     Differential diagnosis includes but is not limited to DDX: MI, angina, aortic dissection, pneumonia, PE, pneumothorax, musculoskeletal pain, cardiomyopathy, bronchitis, COPD, CHF, dyspepsia, gastric ulcer disease, esophagitis, plueural effusion, sarcoidosis, atypical pneumonia, TB   ED Clinical Impression   1. Chest discomfort   2. Chills      ED Assessment/Plan  Patient presented to the ED with a chief complaint of chills and chest discomfort.  Upon arrival to patient,  he was resting on the hospital chair in no acute distress.  Physical exam was markable for the above findings.  CBC and CMP were unremarkable.  Serial troponins were not elevated.  EKG showed normal sinus rhythm.  Per my viewing, I did not note any acute ischemic findings to indicate a STEMI.  Chest x-ray showed no active cardiopulmonary disease.  Per my viewing, I did not note any acute findings.  Overall, patient's workup is reassuring.  With no troponin elevation and a nonischemic appearing EKG, I have a low suspicion for ACS.  Additionally, patient has a heart score of 1 indicating low risk of MACE.  Patient is PERC negative.   Strict return precautions were discussed and patient was given the rest of his discharge instructions.  He stated that he understood and had no further questions.  He was discharged in stable condition.  ED Meds Given During Visit Medications - No data to display    Discharge Medication List as of 04/27/2024 12:51 AM        FOLLOW UP [facility] -  EMERGENCY DEPARTMENT [address] [place]  [id number] [phone]  As needed  [facility] [place] [address] [place]    [id number] [phone]  As needed to establish a PCP   Electronically signed by: 12:40 AM 04/27/2024 for Allyson Schlagheck PA-C        [1] Past Medical History: Diagnosis Date  . Closed fracture of shaft of femur (CMD)    B/L  . Closed supracondylar fracture of femur (CMD)    left  [2] Past Surgical History: Procedure Laterality Date  . FEMUR FRACTURE SURGERY Bilateral 09-2009   Procedure: FEMUR FRACTURE SURGERY  . FEMUR HARDWARE REMOVAL Left 04/20/2014   Procedure: HARDWARE REMOVAL FEMUR;  Surgeon: Selinda Reyes Charter, MD;  Location: [facility] OUTPATIENT OR;  Service: Orthopedics;  Laterality: Left;  [3] No Known Allergies [4] Family History Problem Relation Name Age of Onset  . Anesthesia problems Neg Hx    [5] Social History Tobacco Use  . Smoking status: Never  . Smokeless tobacco: Never  Substance Use Topics  . Alcohol use: Yes  . Drug use: Unknown    Types: Marijuana    Comment: Drug use: Drug Use: Yes; occass marijuana use  *Some images could not be shown.

## 2024-04-30 ENCOUNTER — Emergency Department (HOSPITAL_COMMUNITY): Payer: MEDICAID

## 2024-04-30 ENCOUNTER — Emergency Department (HOSPITAL_COMMUNITY)
Admission: EM | Admit: 2024-04-30 | Discharge: 2024-04-30 | Disposition: A | Discharge status: Home / Self Care | Payer: MEDICAID | Attending: Emergency Medicine | Admitting: Emergency Medicine

## 2024-04-30 ENCOUNTER — Other Ambulatory Visit: Payer: Self-pay

## 2024-04-30 DIAGNOSIS — M25561 Pain in right knee: Secondary | ICD-10-CM | POA: Insufficient documentation

## 2024-04-30 DIAGNOSIS — G8929 Other chronic pain: Secondary | ICD-10-CM | POA: Insufficient documentation

## 2024-04-30 MED ORDER — KETOROLAC TROMETHAMINE 15 MG/ML IJ SOLN
15.0000 mg | Freq: Once | INTRAMUSCULAR | Status: AC
  Administered 2024-04-30: 15 mg via INTRAMUSCULAR
  Filled 2024-04-30: qty 1

## 2024-04-30 NOTE — ED Triage Notes (Signed)
 Patient reports pain at right knee this evening , denies injury/ambulatory .

## 2024-04-30 NOTE — Discharge Instructions (Signed)
 It was a pleasure taking care of you today. You were seen in the Emergency Department for evaluation of right knee pain. Your work-up was reassuring. Your x-ray shows no acute abnormality and your hardware appears to be in place.  As discussed, I recommend taking Tylenol  and/or ibuprofen  as needed for pain.  You will may also elevate your leg at the end of the day above your heart, to decrease swelling and pain.  Additionally, you did mention wanting to have the rod in your femur removed.  You will need to follow-up with orthopedics for further evaluation for this.  I did provide you with a referral.  Their contact information is listed above and you will need to call to schedule an appointment.  Refer to the attached documentation for further management of your symptoms.   Please return to the ER if you experience chest pain, trouble breathing, intractable nausea/vomiting or any other life threatening illnesses.
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
